# Patient Record
Sex: Female | Born: 1960 | Race: White | Hispanic: No | Marital: Married | Smoking: Never smoker
Health system: Southern US, Community
[De-identification: ages and names within clinical notes are randomized; demographics above are authoritative.]

## PROBLEM LIST (undated history)

## (undated) HISTORY — PX: BREAST BIOPSY: SHX20

## (undated) HISTORY — PX: TUBAL LIGATION: SHX77

## (undated) HISTORY — PX: OTHER SURGICAL HISTORY: SHX169

## (undated) HISTORY — PX: BREAST SURGERY: SHX581

## (undated) HISTORY — PX: FOOT SURGERY: SHX648

## (undated) HISTORY — PX: APPENDECTOMY: SHX54

---

## 1990-07-28 HISTORY — PX: REDUCTION MAMMAPLASTY: SUR839

## 1998-07-28 HISTORY — PX: OTHER SURGICAL HISTORY: SHX169

## 2005-12-31 ENCOUNTER — Ambulatory Visit: Payer: Self-pay | Admitting: Family Medicine

## 2006-02-11 ENCOUNTER — Ambulatory Visit: Payer: Self-pay | Admitting: Family Medicine

## 2006-02-11 ENCOUNTER — Other Ambulatory Visit: Admission: RE | Admit: 2006-02-11 | Discharge: 2006-02-11 | Payer: Self-pay | Admitting: Family Medicine

## 2006-05-05 DIAGNOSIS — E7800 Pure hypercholesterolemia, unspecified: Secondary | ICD-10-CM

## 2006-05-05 DIAGNOSIS — K219 Gastro-esophageal reflux disease without esophagitis: Secondary | ICD-10-CM | POA: Insufficient documentation

## 2006-06-12 ENCOUNTER — Encounter: Payer: Self-pay | Admitting: Family Medicine

## 2006-08-19 ENCOUNTER — Ambulatory Visit: Payer: Self-pay | Admitting: Family Medicine

## 2006-08-19 DIAGNOSIS — S139XXA Sprain of joints and ligaments of unspecified parts of neck, initial encounter: Secondary | ICD-10-CM

## 2006-11-25 ENCOUNTER — Ambulatory Visit: Payer: Self-pay | Admitting: Family Medicine

## 2007-03-10 ENCOUNTER — Telehealth: Payer: Self-pay | Admitting: Family Medicine

## 2007-03-23 ENCOUNTER — Ambulatory Visit: Payer: Self-pay | Admitting: Family Medicine

## 2007-03-23 ENCOUNTER — Encounter: Admission: RE | Admit: 2007-03-23 | Discharge: 2007-03-23 | Payer: Self-pay | Admitting: Family Medicine

## 2007-03-24 ENCOUNTER — Encounter: Payer: Self-pay | Admitting: Family Medicine

## 2007-03-24 LAB — CONVERTED CEMR LAB
AST: 12 units/L (ref 0–37)
Albumin: 4.6 g/dL (ref 3.5–5.2)
Alkaline Phosphatase: 67 units/L (ref 39–117)
CO2: 24 meq/L (ref 19–32)
Chloride: 106 meq/L (ref 96–112)
Cholesterol, target level: 200 mg/dL
Creatinine, Ser: 0.68 mg/dL (ref 0.40–1.20)
Glucose, Bld: 99 mg/dL (ref 70–99)
HDL: 45 mg/dL (ref >39)
LDL Cholesterol: 146 mg/dL — ABNORMAL HIGH (ref 0–99)
LDL Goal: 160 mg/dL
TSH: 1.33 microintl units/mL (ref 0.350–5.50)

## 2007-12-27 ENCOUNTER — Ambulatory Visit: Payer: Self-pay | Admitting: Family Medicine

## 2007-12-31 ENCOUNTER — Telehealth: Payer: Self-pay | Admitting: Family Medicine

## 2008-01-18 ENCOUNTER — Ambulatory Visit: Payer: Self-pay | Admitting: Family Medicine

## 2008-01-18 ENCOUNTER — Encounter: Admission: RE | Admit: 2008-01-18 | Discharge: 2008-01-18 | Payer: Self-pay | Admitting: Family Medicine

## 2008-01-19 LAB — CONVERTED CEMR LAB
Alkaline Phosphatase: 61 units/L (ref 39–117)
Amylase: 33 units/L (ref 0–105)
BUN: 10 mg/dL (ref 6–23)
Basophils Absolute: 0 10*3/uL (ref 0.0–0.1)
HCT: 41.7 % (ref 36.0–46.0)
Lymphocytes Relative: 32 % (ref 12–46)
Lymphs Abs: 1.7 10*3/uL (ref 0.7–4.0)
Monocytes Absolute: 0.3 10*3/uL (ref 0.1–1.0)
Monocytes Relative: 5 % (ref 3–12)
Neutro Abs: 3.2 10*3/uL (ref 1.7–7.7)
Neutrophils Relative %: 62 % (ref 43–77)
Platelets: 296 10*3/uL (ref 150–400)
RBC: 4.49 M/uL (ref 3.87–5.11)
TSH: 0.893 microintl units/mL (ref 0.350–5.50)

## 2008-01-21 ENCOUNTER — Encounter: Payer: Self-pay | Admitting: Family Medicine

## 2008-05-11 ENCOUNTER — Telehealth: Payer: Self-pay | Admitting: Family Medicine

## 2008-06-06 ENCOUNTER — Telehealth: Payer: Self-pay | Admitting: Family Medicine

## 2008-09-18 ENCOUNTER — Encounter: Payer: Self-pay | Admitting: Family Medicine

## 2008-09-18 ENCOUNTER — Other Ambulatory Visit: Admission: RE | Admit: 2008-09-18 | Discharge: 2008-09-18 | Payer: Self-pay | Admitting: Family Medicine

## 2008-09-18 ENCOUNTER — Ambulatory Visit: Payer: Self-pay | Admitting: Family Medicine

## 2008-09-18 ENCOUNTER — Encounter: Admission: RE | Admit: 2008-09-18 | Discharge: 2008-09-18 | Payer: Self-pay | Admitting: Family Medicine

## 2008-09-18 DIAGNOSIS — K589 Irritable bowel syndrome without diarrhea: Secondary | ICD-10-CM | POA: Insufficient documentation

## 2008-09-19 ENCOUNTER — Encounter: Payer: Self-pay | Admitting: Family Medicine

## 2008-09-19 LAB — CONVERTED CEMR LAB
ALT: 12 units/L (ref 0–35)
AST: 15 units/L (ref 0–37)
Albumin: 4.4 g/dL (ref 3.5–5.2)
Alkaline Phosphatase: 66 units/L (ref 39–117)
CO2: 20 meq/L (ref 19–32)
Glucose, Bld: 112 mg/dL — ABNORMAL HIGH (ref 70–99)
HDL: 43 mg/dL (ref >39)
TSH: 0.887 microintl units/mL (ref 0.350–4.50)
Total Protein: 6.9 g/dL (ref 6.0–8.3)

## 2008-09-21 LAB — CONVERTED CEMR LAB: Pap Smear: NORMAL

## 2008-11-06 ENCOUNTER — Telehealth: Payer: Self-pay | Admitting: Family Medicine

## 2009-07-28 HISTORY — PX: OTHER SURGICAL HISTORY: SHX169

## 2009-09-19 ENCOUNTER — Telehealth: Payer: Self-pay | Admitting: Family Medicine

## 2009-10-26 ENCOUNTER — Encounter: Payer: Self-pay | Admitting: Family Medicine

## 2009-10-29 ENCOUNTER — Ambulatory Visit: Payer: Self-pay | Admitting: Family Medicine

## 2009-10-30 LAB — CONVERTED CEMR LAB
Cholesterol: 219 mg/dL — ABNORMAL HIGH (ref 0–200)
LDL Cholesterol: 144 mg/dL — ABNORMAL HIGH (ref 0–99)
Potassium: 4.2 meq/L (ref 3.5–5.3)
Sodium: 140 meq/L (ref 135–145)
Total CHOL/HDL Ratio: 5.8

## 2009-11-26 ENCOUNTER — Telehealth: Payer: Self-pay | Admitting: Family Medicine

## 2009-12-12 ENCOUNTER — Telehealth: Payer: Self-pay | Admitting: Family Medicine

## 2009-12-12 DIAGNOSIS — M549 Dorsalgia, unspecified: Secondary | ICD-10-CM | POA: Insufficient documentation

## 2009-12-31 ENCOUNTER — Encounter: Admission: RE | Admit: 2009-12-31 | Discharge: 2009-12-31 | Payer: Self-pay | Admitting: Family Medicine

## 2010-03-07 ENCOUNTER — Ambulatory Visit: Payer: Self-pay | Admitting: Family Medicine

## 2010-03-07 DIAGNOSIS — M2550 Pain in unspecified joint: Secondary | ICD-10-CM | POA: Insufficient documentation

## 2010-03-08 ENCOUNTER — Encounter: Payer: Self-pay | Admitting: Family Medicine

## 2010-03-08 LAB — CONVERTED CEMR LAB
Basophils Absolute: 0 10*3/uL (ref 0.0–0.1)
Basophils Relative: 0 % (ref 0–1)
Eosinophils Absolute: 0.1 10*3/uL (ref 0.0–0.7)
Eosinophils Relative: 1 % (ref 0–5)
Hemoglobin: 13.7 g/dL (ref 12.0–15.0)
Lymphs Abs: 2.7 10*3/uL (ref 0.7–4.0)
MCHC: 32.7 g/dL (ref 30.0–36.0)
Neutro Abs: 3.8 10*3/uL (ref 1.7–7.7)
Platelets: 291 10*3/uL (ref 150–400)
Sed Rate: 18 mm/hr (ref 0–22)
WBC: 7.2 10*3/uL (ref 4.0–10.5)

## 2010-03-12 ENCOUNTER — Telehealth: Payer: Self-pay | Admitting: Family Medicine

## 2010-03-12 DIAGNOSIS — M25569 Pain in unspecified knee: Secondary | ICD-10-CM | POA: Insufficient documentation

## 2010-03-13 ENCOUNTER — Encounter: Admission: RE | Admit: 2010-03-13 | Discharge: 2010-03-13 | Payer: Self-pay | Admitting: Family Medicine

## 2010-03-13 ENCOUNTER — Telehealth: Payer: Self-pay | Admitting: Family Medicine

## 2010-03-18 ENCOUNTER — Encounter: Payer: Self-pay | Admitting: Family Medicine

## 2010-04-17 ENCOUNTER — Encounter: Payer: Self-pay | Admitting: Family Medicine

## 2010-04-17 ENCOUNTER — Encounter: Admission: RE | Admit: 2010-04-17 | Discharge: 2010-04-17 | Payer: Self-pay | Admitting: Internal Medicine

## 2010-05-20 ENCOUNTER — Telehealth: Payer: Self-pay | Admitting: Family Medicine

## 2010-05-31 ENCOUNTER — Telehealth: Payer: Self-pay | Admitting: Family Medicine

## 2010-06-03 ENCOUNTER — Encounter: Payer: Self-pay | Admitting: Family Medicine

## 2010-08-27 NOTE — Letter (Signed)
Summary: [facility]   Imported By: Lanelle Bal 06/24/2010 08:22:18  _____________________________________________________________________  External Attachment:    Type:   Image     Comment:   External Document

## 2010-08-27 NOTE — Progress Notes (Signed)
Summary: Referral   Phone Note Call from Patient   Caller: Patient Summary of Call: Pt would like referral to [facility] to see (sp?)  Dr. Patton Salles for a breast reduction. Pt discussed this at CPE w/ Dr. Judie Petit but needs referral for insurance. Initial call taken by: Payton Spark CMA,  Dec 12, 2009 3:10 PM  New Problems: BACK PAIN (ICD-724.5)   New Problems: BACK PAIN (ICD-724.5)

## 2010-08-27 NOTE — Progress Notes (Signed)
Summary: ortho requesting a ortho referral   Phone Note From Other Clinic   Caller: Receptionist Summary of Call: Dr. Nilsa Nutting office called and pt has decided to see Dr.Olin before P.T and they need a Ortho order Referral fax l to [phone] Initial call taken by: Michaelle Copas,  May 31, 2010 1:22 PM  Follow-up for Phone Call        That is fine. I put it in. Will you sign adn print? Follow-up by: Nani Gasser MD,  May 31, 2010 2:36 PM  Additional Follow-up for Phone Call Additional follow up Details #1::        Order faxed. Additional Follow-up by: Kathlene November LPN,  May 31, 2010 3:13 PM

## 2010-08-27 NOTE — Progress Notes (Signed)
Summary: knee pain  Phone Note Call from Patient   Caller: Patient Call For: Tonya Gasser MD Summary of Call: pt called and states she is still having the knee pain and the prednisone did not help at all.Pt is taking "pain pills from her surgery" because the meloxicam did not help. Wants to know what to do. wants to know if we can go ahead and set her up for a rheum appt Initial call taken by: Avon Gully CMA, Duncan Dull),  March 12, 2010 3:29 PM  Follow-up for Phone Call        Absolutely. Lets refer to Rheum.  Has she had an xray of her knee?  Follow-up by: Tonya Gasser MD,  March 12, 2010 5:13 PM  Additional Follow-up for Phone Call Additional follow up Details #1::        Pt  is scheduled with rheumatology next week the 22nd. Has not had any xrays of knee. But is really hurting her and wants to know what she can do.Has 2 days left of the prednisone and it is not helping. Using pain pills from surgery and it is not helping it s a constant throb. Please advise Pt doesnt know what med regiman to do during day because by the time she gets home and takes a pain pill she is miserable Additional Follow-up by: Kathlene November,  March 13, 2010 10:10 AM  New Problems: KNEE PAIN, RIGHT [phone])   Additional Follow-up for Phone Call Additional follow up Details #2::    Lets get xray.  Also lets try tramadol for daytime and then hydrocodone for evenings once home.  Follow-up by: Tonya Gasser MD,  March 13, 2010 11:09 AM  Additional Follow-up for Phone Call Additional follow up Details #3:: Details for Additional Follow-up Action Taken: Pt notified of above and that med sent to pharmacy Additional Follow-up by: Kathlene November,  March 13, 2010 11:51 AM  New Problems: KNEE PAIN, RIGHT (ICD-719.46) New/Updated Medications: HYDROCODONE-ACETAMINOPHEN 5-325 MG TABS (HYDROCODONE-ACETAMINOPHEN) 1-2 tabs in the evening as needed TRAMADOL HCL 200 MG XR24H-TAB (TRAMADOL HCL) Take 1  tablet by mouth once a day in the AM. Prescriptions: TRAMADOL HCL 200 MG XR24H-TAB (TRAMADOL HCL) Take 1 tablet by mouth once a day in the AM.  #30 x 1   Entered and Authorized by:   Tonya Gasser MD   Signed by:   Tonya Gasser MD on 03/13/2010   Method used:   Electronically to        [facility] [phone]* (retail)       [address] [place], [place]  [address]       Ph: [phone]       Fax: [phone]   RxID:   [id number] HYDROCODONE-ACETAMINOPHEN 5-325 MG TABS (HYDROCODONE-ACETAMINOPHEN) 1-2 tabs in the evening as needed  #60 x 0   Entered and Authorized by:   Tonya Gasser MD   Signed by:   Tonya Gasser MD on 03/13/2010   Method used:   Printed then faxed to ...       [address] [phone]* (retail)       [address] [place], [place]  [address]       Ph: [phone]       Fax: [phone]   RxID:   [id number]

## 2010-08-27 NOTE — Progress Notes (Signed)
Summary: PA for referral  Phone Note From Other Clinic   Caller: Pat at Dr. Shawnee Knapp office Summary of Call: Pt will need to have PA from insurance company prior to apt. Please call Dennie Bible for more info [phone] Initial call taken by: Payton Spark CMA,  March 13, 2010 12:39 PM  Follow-up for Phone Call        Called and spoke with Dennie Bible. Faxed her the completed [employer] referral form and faxed one to [facility] Follow-up by: Kathlene November,  March 13, 2010 1:35 PM

## 2010-08-27 NOTE — Progress Notes (Signed)
Summary: Ear itching  Phone Note Call from Patient Call back at Work Phone [phone]   Caller: Patient Call For: Nani Gasser MD Action Taken: Patient advised to call [phone] Summary of Call: pt states that the Claritin bothered her stomach same as Zyrtec did. Left ear still itching and some swelling and wondered if she could get a topical to put in the ear. Said along time ago a doc gave her something that was clear and she put on Qtip and put in the ear Initial call taken by: Kathlene November,  Nov 26, 2009 12:39 PM  Follow-up for Phone Call        OK sent rx to pharm. Call if not better in one week.  Follow-up by: Nani Gasser MD,  Nov 26, 2009 1:09 PM  Additional Follow-up for Phone Call Additional follow up Details #1::        Pt notified. Additional Follow-up by: Kathlene November,  Nov 26, 2009 1:17 PM    New/Updated Medications: CORTISPORIN 1 % OINT (BACIT-POLY-NEO HC) Apply to inner ear two times a day with Qtip for one week Prescriptions: CORTISPORIN 1 % OINT (BACIT-POLY-NEO HC) Apply to inner ear two times a day with Qtip for one week  #1 tube x 0   Entered and Authorized by:   Nani Gasser MD   Signed by:   Nani Gasser MD on 11/26/2009   Method used:   Electronically to        [facility] [phone]* (retail)       [address] [place], [place]  [address]       Ph: [phone]       Fax: [phone]   RxID:   [id number]

## 2010-08-27 NOTE — Medication Information (Signed)
Summary: Prevacid/[employer]  Prevacid/[employer]   Imported By: Lanelle Bal 11/05/2009 10:39:57  _____________________________________________________________________  External Attachment:    Type:   Image     Comment:   External Document

## 2010-08-27 NOTE — Assessment & Plan Note (Signed)
Summary: ear itching, nipple chaffing.    Vital Signs:  Patient profile:   50 year old female Height:      66 inches Weight:      190 pounds BMI:     30.78 Pulse rate:   64 / minute BP sitting:   124 / 77  (left arm) Cuff size:   regular  Vitals Entered By: Kathlene November (October 29, 2009 9:54 AM) CC: wants bloodwork- left ear itching ? allergies. Wants mole looked at on left hip, back and under right axilla   Primary Care Provider:  Linford Arnold, c  CC:  wants bloodwork- left ear itching ? allergies. Wants mole looked at on left hip and back and under right axilla.  History of Present Illness: wants bloodwork- left ear itching ? allergies. Wants mole looked at on left hip, back and under right axilla.  Left ear has bothered her before and was given a cream. Went to ENT at one time and was told it was allergy related. Started some benadryl but it does upset her stomach and does help some.  Feels like  adeep itch.  No drainage or hearing loss. Using her prevacid and Zantac.  No it chy nose or watery nose.   she has been working out 4-5 days a week and has lost 5 lbs.  She is thinking about having another breast reduction.  She feels the weight down her necka nd shoulder and "get in her way with exercise". She has also been getting sore nipples iwht working out.   Current Medications (verified): 1)  Glucosamine-Chondroitin  Tabs (Glucosamine-Chondroitin Tabs) .... Once Daily 2)  Calcium 600 1500 Mg Tabs (Calcium Carbonate) .... Take One Tablet Twice A Day 3)  Diflunisal 500 Mg Tabs (Diflunisal) .... Take 1 Tablet By Mouth Two Times A Day As Needed Forpain 4)  Prevacid 30 Mg Cpdr (Lansoprazole) .... Take 1 Tablet By Mouth Once A Day  Allergies (verified): 1)  ! Sulfa  Comments:  Nurse/Medical Assistant: The patient's medications and allergies were reviewed with the patient and were updated in the Medication and Allergy Lists. Kathlene November (October 29, 2009 9:55 AM)  Physical  Exam  General:  Well-developed,well-nourished,in no acute distress; alert,appropriate and cooperative throughout examination Head:  Normocephalic and atraumatic without obvious abnormalities. No apparent alopecia or balding. Eyes:  No corneal or conjunctival inflammation noted. EOMI. Perrla. Ears:  External ear exam shows no significant lesions or deformities.  Otoscopic examination reveals clear canals, tympanic membranes are intact bilaterally without bulging, retraction, inflammation or discharge. Hearing is grossly normal bilaterally. Nose:  External nasal examination shows no deformity or inflammation. Nasal mucosa are pink and moist without lesions or exudates. Mouth:  Oral mucosa and oropharynx without lesions or exudates.  Teeth in good repair. Neck:  No deformities, masses, or tenderness noted. Lungs:  Normal respiratory effort, chest expands symmetrically. Lungs are clear to auscultation, no crackles or wheezes. Heart:  Normal rate and regular rhythm. S1 and S2 normal without gallop, murmur, click, rub or other extra sounds. Skin:  no rashes.  Has several seborrheic keratosise on her low and upper back.  Has several tag moles under her right axilla.  Psych:  Cognition and judgment appear intact. Alert and cooperative with normal attention span and concentration. No apparent delusions, illusions, hallucinations   Impression & Recommendations:  Problem # 1:  UNSPECIFIED PRURITIC DISORDER (ICD-698.9) I do think the ear itching is allergy related. Recommend trial of OTC claritin or zyrtec as  the benadryl does help but only works for about 4-6 hours.  I don't see any eczemaor rash in the ear so will hold off on any topical creams.    Problem # 2:  OTHER ABNORMAL GLUCOSE (ICD-790.29) Due to recheck today.  Orders: T-Basic Metabolic Panel ([id number])  Problem # 3:  HYPERCHOLESTEROLEMIA (ICD-272.0) Due to recheck today.  Orders: T-Lipid Profile ([id number])  Problem # 4:   UNSPECIFIED BREAST DISORDER (ICD-611.9) For the nipple chaffing discussed getting a really good fitting bra with adjustable straps and a front closure that is very secure.    Complete Medication List: 1)  Glucosamine-chondroitin Tabs (Glucosamine-chondroitin tabs) .... Once daily 2)  Calcium 600 1500 Mg Tabs (Calcium carbonate) .... Take one tablet twice a day 3)  Diflunisal 500 Mg Tabs (Diflunisal) .... Take 1 tablet by mouth two times a day as needed forpain 4)  Prevacid 30 Mg Cpdr (Lansoprazole) .... Take 1 tablet by mouth once a day

## 2010-08-27 NOTE — Assessment & Plan Note (Signed)
Summary: Rash, Joint pain and swelling.    Vital Signs:  Patient profile:   50 year old female Height:      66 inches Weight:      190 pounds Pulse rate:   60 / minute BP sitting:   113 / 69  (left arm) Cuff size:   regular  Vitals Entered By: Kathlene November (March 07, 2010 1:00 PM) CC: rash on back x 1 week- itches also has under breast for a while now. Also having right knee, right shoulder and 2nd digit on right hand joint pain ? arthritis   Primary Care Provider:  Linford Arnold, c  CC:  rash on back x 1 week- itches also has under breast for a while now. Also having right knee and right shoulder and 2nd digit on right hand joint pain ? arthritis.  History of Present Illness: rash on back x 1 week- itches also has under breast for a while now. Ithcing is very mild. Hasn't tried any creams or treatments.    Also having right knee, right shoulder and 2nd digit on right hand joint pain ? arthritis.  Has been usin IBU1000mg  daily and does help but wonders if something stronger.  Tody her right knee and right firts finger DIP and the right shoulder are very painful. The joint on th ehand is swollen and slightly red. Went back on glucosamin on 12/22/2022 ( had surgery recently so had to come off).  Using thermacare patches on her knee.  Not really helping.   Current Medications (verified): 1)  Glucosamine-Chondroitin  Tabs (Glucosamine-Chondroitin Tabs) .... Once Daily 2)  Calcium 600 1500 Mg Tabs (Calcium Carbonate) .... Take One Tablet Twice A Day 3)  Diflunisal 500 Mg Tabs (Diflunisal) .... Take 1 Tablet By Mouth Two Times A Day As Needed Forpain 4)  Prevacid 30 Mg Cpdr (Lansoprazole) .... Take 1 Tablet By Mouth Once A Day 5)  Fish Oil 1000 Mg Caps (Omega-3 Fatty Acids) .... 2 By Mouth Daily. 6)  Cortisporin 1 % Oint (Bacit-Poly-Neo Hc) .... Apply To Inner Ear Two Times A Day With Qtip For One Week  Allergies (verified): 1)  ! Sulfa  Comments:  Nurse/Medical Assistant: The patient's  medications and allergies were reviewed with the patient and were updated in the Medication and Allergy Lists. Kathlene November (March 07, 2010 1:02 PM)  Family History: Father died Lung Ca (mesothelioma)  Dec 22, 1987, gout  Physical Exam  General:  Well-developed,well-nourished,in no acute distress; alert,appropriate and cooperative throughout examination Msk:  Right knee with NROM. No inc warmth or erythema but very tender over the top medial edge of the tibia and along the medial joint line. The first finger on the right hand DIP jonit is swollen and with slight erythema. Decreased flexion secondary to swelling.  Skin:  BAck with diffuse oval dry light tan macules in a christmas tree pattern on her back. Also has a hyperpigmendt patch under each breast with some dry scaling as well. Rash is also onher upper chest and on her neck.  Psych:  Cognition and judgment appear intact. Alert and cooperative with normal attention span and concentration. No apparent delusions, illusions, hallucinations   Impression & Recommendations:  Problem # 1:  SKIN RASH (ICD-782.1) Assessment New The rash looks like pityriasis rosea but will scrape for KOH to rule out tinea.  Will call with results.  Orders: T-KOH Prep Fungal ([id number])  Problem # 2:  POLYARTHRITIS (URK-270.62) Assessment: New Discussed RA vs gout vs OA.  Pt  sxs most consistant with OA at this point but will check sed rate since the arthritis tends to migrate and they do get red and swolen. Since her pain is severe today will treat wiht steroids. then can try the meloxicam and see if likes it better than the IBU. Warned not to mix the two together. Can take Tyelnol if needes supplement pain management.  Can consider rheum referral.  Orders: T-CBC w/Diff ([id number]) T-TSH [id number]) T-Sed Rate (Automated) [phone]) T-Rheumatoid Factor [id number]) T-Uric Acid (Blood) [id number])  Complete Medication List: 1)  Glucosamine-chondroitin  Tabs (Glucosamine-chondroitin tabs) .... Once daily 2)  Calcium 600 1500 Mg Tabs (Calcium carbonate) .... Take one tablet twice a day 3)  Diflunisal 500 Mg Tabs (Diflunisal) .... Take 1 tablet by mouth two times a day as needed forpain 4)  Prevacid 30 Mg Cpdr (Lansoprazole) .... Take 1 tablet by mouth once a day 5)  Fish Oil 1000 Mg Caps (Omega-3 fatty acids) .... 2 by mouth daily. 6)  Cortisporin 1 % Oint (Bacit-poly-neo hc) .... Apply to inner ear two times a day with qtip for one week 7)  Meloxicam 15 Mg Tabs (Meloxicam) .... Take 1 tablet by mouth once a day 8)  Prednisone 20 Mg Tabs (Prednisone) .... 2 tabs by mouth daily for 7 days  Patient Instructions: 1)  Please schedule a follow-up appointment in 1 month for joint pain.  Prescriptions: PREDNISONE 20 MG TABS (PREDNISONE) 2 tabs by mouth daily for 7 days  #14 x 0   Entered and Authorized by:   Nani Gasser MD   Signed by:   Nani Gasser MD on 03/07/2010   Method used:   Electronically to        [facility] [phone]* (retail)       [address] [place], [place]  [address]       Ph: [phone]       Fax: [phone]   RxID:   [id number] MELOXICAM 15 MG TABS (MELOXICAM) Take 1 tablet by mouth once a day  #30 x 1   Entered and Authorized by:   Nani Gasser MD   Signed by:   Nani Gasser MD on 03/07/2010   Method used:   Electronically to        [facility] [phone]* (retail)       [address] [place], [place]  [address]       Ph: [phone]       Fax: [phone]   RxID:   [id number]

## 2010-08-27 NOTE — Progress Notes (Signed)
Summary: Amitiza Rx  Phone Note Call from Patient Call back at Work Phone [phone]   Caller: Patient Call For: Nani Gasser MD Summary of Call: Pt would like Rx for Amitiza sent to [facility]- said discussed last time she was in here Initial call taken by: Kathlene November,  September 19, 2009 9:52 AM  Follow-up for Phone Call        OK will send. Let me know if helping.  Follow-up by: Nani Gasser MD,  September 19, 2009 10:12 AM  Additional Follow-up for Phone Call Additional follow up Details #1::        Pt notified med sent to pharmacy Additional Follow-up by: Kathlene November,  September 19, 2009 10:18 AM    New/Updated Medications: AMITIZA 24 MCG CAPS (LUBIPROSTONE) Take 1 tablet by mouth two times a day Prescriptions: AMITIZA 24 MCG CAPS (LUBIPROSTONE) Take 1 tablet by mouth two times a day  #60 x 0   Entered and Authorized by:   Nani Gasser MD   Signed by:   Nani Gasser MD on 09/19/2009   Method used:   Electronically to        [facility] [phone]* (retail)       [address] [place], [place]  [address]       Ph: [phone]       Fax: [phone]   RxID:   [id number]

## 2010-08-27 NOTE — Consult Note (Signed)
Summary: [facility]   Imported By: Lanelle Bal 04/04/2010 09:37:40  _____________________________________________________________________  External Attachment:    Type:   Image     Comment:   External Document

## 2010-08-27 NOTE — Progress Notes (Signed)
Summary: ? appt confusion  Phone Note Call from Patient Call back at Work Phone [phone]   Caller: Patient Call For: Nani Gasser MD Summary of Call: Pt calls and ask to speak with you in regards to her PT appt. She said she thought it was at ortho office. I looked under order and its here in [place]. She said there is confusion apparantly. Please call her back Initial call taken by: Kathlene November LPN,  May 20, 2010 9:03 AM  Follow-up for Phone Call        called and left message to confirm with pt as we had spoke earlier that appt has been scheduled at [place] [facility] and appt is 1030 11/11 with Dr. Charlann Boxer. Follow-up by: Avon Gully CMA, Duncan Dull),  May 20, 2010 10:19 AM

## 2010-09-18 ENCOUNTER — Encounter: Payer: Self-pay | Admitting: Family Medicine

## 2010-09-23 ENCOUNTER — Encounter (INDEPENDENT_AMBULATORY_CARE_PROVIDER_SITE_OTHER): Payer: Managed Care, Other (non HMO) | Admitting: Family Medicine

## 2010-09-23 ENCOUNTER — Other Ambulatory Visit (HOSPITAL_COMMUNITY)
Admission: RE | Admit: 2010-09-23 | Discharge: 2010-09-23 | Disposition: A | Discharge status: Home / Self Care | Payer: Managed Care, Other (non HMO) | Source: Ambulatory Visit | Attending: Family Medicine | Admitting: Family Medicine

## 2010-09-23 ENCOUNTER — Other Ambulatory Visit: Payer: Self-pay | Admitting: Family Medicine

## 2010-09-23 ENCOUNTER — Encounter: Payer: Self-pay | Admitting: Family Medicine

## 2010-09-23 ENCOUNTER — Telehealth: Payer: Self-pay | Admitting: Family Medicine

## 2010-09-23 DIAGNOSIS — Z0000 Encounter for general adult medical examination without abnormal findings: Secondary | ICD-10-CM

## 2010-09-23 DIAGNOSIS — G609 Hereditary and idiopathic neuropathy, unspecified: Secondary | ICD-10-CM | POA: Insufficient documentation

## 2010-09-23 DIAGNOSIS — Z1159 Encounter for screening for other viral diseases: Secondary | ICD-10-CM | POA: Insufficient documentation

## 2010-09-23 DIAGNOSIS — Z01419 Encounter for gynecological examination (general) (routine) without abnormal findings: Secondary | ICD-10-CM | POA: Insufficient documentation

## 2010-09-23 DIAGNOSIS — K589 Irritable bowel syndrome without diarrhea: Secondary | ICD-10-CM

## 2010-09-24 LAB — CONVERTED CEMR LAB
AST: 14 units/L (ref 0–37)
Alkaline Phosphatase: 58 units/L (ref 39–117)
Calcium: 9.4 mg/dL (ref 8.4–10.5)
HDL: 33 mg/dL — ABNORMAL LOW (ref >39)
Potassium: 4.2 meq/L (ref 3.5–5.3)
Total Bilirubin: 0.5 mg/dL (ref 0.3–1.2)
Total CHOL/HDL Ratio: 6.2

## 2010-09-26 ENCOUNTER — Encounter: Payer: Self-pay | Admitting: Family Medicine

## 2010-09-27 ENCOUNTER — Encounter: Payer: Self-pay | Admitting: Family Medicine

## 2010-09-27 LAB — CYTOLOGY - PAP: Pap Smear: NORMAL

## 2010-10-03 NOTE — Progress Notes (Signed)
  Phone Note Call from Patient   Summary of Call: labs for eCPE.  Initial call taken by: Nani Gasser MD,  September 23, 2010 8:43 AM  New Problems: EXAMINATION, ROUTINE MEDICAL (ICD-V70.0)   New Problems: EXAMINATION, ROUTINE MEDICAL (ICD-V70.0)

## 2010-10-03 NOTE — Assessment & Plan Note (Signed)
Summary: CPE w/ pap   Vital Signs:  Patient profile:   50 year old female Height:      66 inches Weight:      194 pounds Pulse rate:   67 / minute BP sitting:   138 / 80  (right arm) Cuff size:   regular  Vitals Entered By: Avon Gully CMA, (AAMA) (September 23, 2010 10:08 AM) CC: CPE and PAP   Primary Care Provider:  Levis Nazir, c  CC:  CPE and PAP.  History of Present Illness: Recently started exercising again. she is trying to lose some weight.  Says her feet have really been bothering here. she reports she has had tingling and sharp pain prick pains in her feet.  She says that often times is worse the day after her she tries to walk or exercise a lot.  She feels actually get a little red and swollen as well she denies any trauma.  She has no history of diabetes.  She does look as if her most of her feet bilaterally.she said she wears good supportive shoe wear on a daily basis.  she is still having a lot of problems with her bowels.  She frequently will screen from diarrhea to constipation.  She does feel she is probably constipation predominant.  She denies any blood in the stool but has had a lot of problems with her hemorrhoids.  She said previously daily bothered her in a while but now it seems that she is constantly battling inflammation in that area.  We had tried her on on its use in the past but she did like the idea of taking pill every day.  Recently she has started MiraLax daily and says that that has actually made a big difference for her especially concerning the constipation.  Though it does tend to make the diarrhea a little worse when it does fit.  She also tried some stool softening pills but this didn't seem to help at all  Current Medications (verified): 1)  Glucosamine-Chondroitin  Tabs (Glucosamine-Chondroitin Tabs) .... Once Daily 2)  Calcium 600 1500 Mg Tabs (Calcium Carbonate) .... Take One Tablet Twice A Day 3)  Diflunisal 500 Mg Tabs (Diflunisal) .... Take  1 Tablet By Mouth Two Times A Day As Needed Forpain 4)  Prevacid 30 Mg Cpdr (Lansoprazole) .... Take 1 Tablet By Mouth Once A Day 5)  Fish Oil 1000 Mg Caps (Omega-3 Fatty Acids) .... 2 By Mouth Daily.  Allergies (verified): 1)  ! Sulfa  Comments:  Nurse/Medical Assistant: The patient's medications and allergies were reviewed with the patient and were updated in the Medication and Allergy Lists. 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  Married to [place] with 4 children.  Never smoked, 1-2 EtOH/mo, no drugs, 1 caff daily, walks for 50-60 min 3 x/week  Past Surgical History: Appendectomy  12-20-1972, Breast Reduction  1993, Tubal ligation  12-21-90, Tummy Tuck   2006, Vag ablation  12/20/97 Breast reductionn and lift Dec 20, 2009  Review of Systems  The patient denies anorexia, fever, weight loss, weight gain, vision loss, decreased hearing, hoarseness, chest pain, syncope, dyspnea on exertion, peripheral edema, prolonged cough, headaches, hemoptysis, abdominal pain, melena, hematochezia, severe indigestion/heartburn, hematuria, incontinence, genital sores, muscle  weakness, suspicious skin lesions, transient blindness, difficulty walking, depression, unusual weight change, abnormal bleeding, enlarged lymph nodes, and breast masses.    Physical Exam  General:  Well-developed,well-nourished,in no acute distress; alert,appropriate and cooperative throughout examination Head:  Normocephalic and atraumatic without obvious abnormalities. No apparent alopecia or balding. Eyes:  No corneal or conjunctival inflammation noted. EOMI. Perrla.  Ears:  External ear exam shows no significant lesions or deformities.   Otoscopic examination reveals clear canals, tympanic membranes are intact bilaterally without bulging, retraction, inflammation or discharge. Hearing is grossly normal bilaterally. Nose:  External nasal examination shows no deformity or inflammation. Mouth:  Oral mucosa and oropharynx without lesions or exudates.  Teeth in good repair. Neck:  No deformities, masses, or tenderness noted. Chest Wall:  No deformities, masses, or tenderness noted. Breasts:  No mass, nodules, thickening, tenderness, bulging, retraction, inflamation, nipple discharge or skin changes noted.   Lungs:  Normal respiratory effort, chest expands symmetrically. Lungs are clear to auscultation, no crackles or wheezes. Heart:  Normal rate and regular rhythm. S1 and S2 normal without gallop, murmur, click, rub or other extra sounds. Abdomen:  Bowel sounds positive,abdomen soft and non-tender without masses, organomegaly or hernias noted. Genitalia:  Normal introitus for age, no external lesions, no vaginal discharge, mucosa pink and moist, no vaginal or cervical lesions, no vaginal atrophy, no friaility or hemorrhage, normal uterus size and position, no adnexal masses or tenderness Msk:  No deformity or scoliosis noted of thoracic or lumbar spine.   Pulses:  R and L carotid,radial,femoral,dorsalis pedis and posterior tibial pulses are full and equal bilaterally Extremities:  No clubbing, cyanosis, edema, or deformity noted with normal full range of motion of all joints.   Neurologic:  No cranial nerve deficits noted. Station and gait are normal.. . Sensory, motor and coordinative functions appear intact. Skin:  no rashes.   Cervical Nodes:  No lymphadenopathy noted Axillary Nodes:  No palpable lymphadenopathy Psych:  Cognition and judgment appear intact. Alert and cooperative with normal attention span and concentration. No apparent delusions, illusions, hallucinations   Impression & Recommendations:  Problem # 1:   EXAMINATION, ROUTINE MEDICAL (ICD-V70.0) Got her labs this AM.   Exam is normal.  her exam is normal today. encourage her to definitely get back into her exercise that she has gained weight since her last office visit. Recommend daily calcium intake.  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  If this is normal then I recommend that she follow-up to have a more thorough discussion about her neuropathy.  She is not diabetic. consider further evaluation with neurology.  Problem # 3:  IRRITABLE BOWEL SYNDROME (ICD-564.1) we discussed that she likely has irritable bowel syndrome.  She did not like the Amitiza.  The MiraLax seems to be working well and she could certainly continue to use this.we also discussed the importance of a high fiber diet and a half IBS.  Recommend starting a chronic Metamucil or Benefiber to help.  This will be in addition to increasing her fruits and veggies in her diet and continuing to stay hydrated.  Also I recommend we refer to GI for further evaluation to rule out other causes as she is close to the age of 82.  That evening.  The bowel syndrome. Orders: Gastroenterology Referral (GI)  Complete Medication List: 1)  Glucosamine-chondroitin  Tabs (Glucosamine-chondroitin tabs) .... Once daily 2)  Calcium 600 1500 Mg Tabs (Calcium carbonate) .... Take one tablet twice a day 3)  Diflunisal 500 Mg Tabs (Diflunisal) .... Take 1 tablet by mouth two times a day as needed forpain 4)  Prevacid 30 Mg Cpdr (Lansoprazole) .... Take 1 tablet by mouth once a day 5)  Fish Oil 1000 Mg Caps (Omega-3 fatty acids) .... 2 by mouth daily.  Patient Instructions: 1)  It is important that you exercise reguarly at least 20 minutes 5 times a week. If you develop chest pain, have severe difficulty breathing, or feel very tired, stop  exercising immediately and seek medical attention.  2)  Take calcium +vitamin D daily.  3)  We will call you with your lab results.    Orders Added: 1)  Gastroenterology Referral [GI] 2)  Est. Patient age 29-64 [37] 3)  Est. Patient Level III [99213]   Immunization History:  Influenza Immunization History:    Influenza:  historical (04/27/2010)   Immunization History:  Influenza Immunization History:    Influenza:  Historical (04/27/2010)     Immunization History:  Influenza Immunization History:    Influenza:  historical (04/27/2010)

## 2010-10-14 ENCOUNTER — Ambulatory Visit: Payer: Managed Care, Other (non HMO) | Admitting: Family Medicine

## 2010-10-14 ENCOUNTER — Ambulatory Visit (INDEPENDENT_AMBULATORY_CARE_PROVIDER_SITE_OTHER): Payer: Managed Care, Other (non HMO) | Admitting: Family Medicine

## 2010-10-14 ENCOUNTER — Encounter: Payer: Self-pay | Admitting: Family Medicine

## 2010-10-14 DIAGNOSIS — G609 Hereditary and idiopathic neuropathy, unspecified: Secondary | ICD-10-CM

## 2010-10-24 NOTE — Assessment & Plan Note (Signed)
Summary: Foot pain, bilat.    Vital Signs:  Patient profile:   50 year old female Height:      66 inches Weight:      194 pounds Pulse rate:   89 / minute BP sitting:   128 / 78  (right arm) Cuff size:   regular  Vitals Entered By: Avon Gully CMA, Duncan Dull) (October 14, 2010 3:59 PM) CC: discuss neuropathy    Primary Care Provider:  Linford Arnold, c  CC:  discuss neuropathy .  History of Present Illness: Foot pain for several years but feels it has gotten worse.  Now has 2 wear flipflops or shoes to even clean her house. Burning with walking.  Will have to change shoes halfway throught the day even out shoppin for the day.  During kettle bell classes has to take her shoes off and her feet felt like they were going numb. When puts pressure on them will feel pins and needles sensation.  More recently feels some cramping under her toe area. On her the left heel it is painful to walk and put pressure on it.  Says changes out her work out shoes every 6 months.  Brother who passed away at [facility] has similar foot sxs.  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  Did try Dr. Idelle Jo inserts.  No radiaiton into the leg and ankle. No periods of non discomfort. doesn' treally take any pain relievers for it.    Allergies: 1)  ! Sulfa  Physical Exam  General:  Well-developed,well-nourished,in no acute distress; alert,appropriate and cooperative throughout examination  Diabetes Management Exam:    Foot Exam (with socks and/or shoes not present):       Sensory-Monofilament:          Left foot: normal          Right foot: normal       Sensory-other: DP pulse 2+ bilat.        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 If not helping then conisder neurontin or  lyrica. I am not sure of a cuase of hre neuropathy. I don't think this is from her back.  No hx of diabetes.  B12 on the low end of normal.   Orders: Podiatry Referral (Podiatry)  Complete Medication List: 1)  Glucosamine-chondroitin Tabs (Glucosamine-chondroitin tabs) .... Once daily 2)  Calcium 600 1500 Mg Tabs (Calcium carbonate) .... Take one tablet twice a day 3)  Diflunisal 500 Mg Tabs (Diflunisal) .... Take 1 tablet by mouth two times a day as needed forpain 4)  Prevacid 30 Mg Cpdr (Lansoprazole) .... Take 1 tablet by mouth once a day 5)  Fish Oil 1000 Mg Caps (Omega-3 fatty acids) .... 4 by mouth daily. 6)  Metanx 3-35-2 Mg Tabs (L-methylfolate-b6-b12) .... Take 1 tablet by mouth once a day   Patient Instructions: 1)  Please schedule a follow-up appointment in 6-8 weeks for neuropathy.  2)  If the metanx is not helping then we can consider lyrica or neurontin, etc for nerve pain Prescriptions: METANX 3-35-2 MG TABS (  L-METHYLFOLATE-B6-B12) Take 1 tablet by mouth once a day  #30 x 1   Entered and Authorized by:   Nani Gasser MD   Signed by:   Nani Gasser MD on 10/14/2010   Method used:   Electronically to        [facility] [phone]* (retail)       [address] [place], [place]  [address]       Ph: [phone]       Fax: [phone]   RxID:   [id number]    Orders Added: 1)  Podiatry Referral [Podiatry] 2)  Est. Patient Level III [74259]

## 2010-12-03 ENCOUNTER — Other Ambulatory Visit: Payer: Self-pay | Admitting: Family Medicine

## 2011-02-24 ENCOUNTER — Other Ambulatory Visit: Payer: Self-pay | Admitting: *Deleted

## 2011-02-24 NOTE — Telephone Encounter (Signed)
OK to change?

## 2011-02-24 NOTE — Telephone Encounter (Signed)
Pt wants to switch her prevacid to prilosec 40mg  capsules and not tablet due to a change in her ins. Pt needs 90 day supply with 1 yr refill

## 2011-02-25 MED ORDER — OMEPRAZOLE 40 MG PO CPDR: 40.0000 mg | DELAYED_RELEASE_CAPSULE | Freq: Every day | ORAL | Status: DC

## 2011-03-12 ENCOUNTER — Encounter: Payer: Self-pay | Admitting: Family Medicine

## 2011-03-13 ENCOUNTER — Encounter: Payer: Self-pay | Admitting: Family Medicine

## 2011-03-13 ENCOUNTER — Ambulatory Visit (INDEPENDENT_AMBULATORY_CARE_PROVIDER_SITE_OTHER): Payer: Managed Care, Other (non HMO) | Admitting: Family Medicine

## 2011-03-13 VITALS — BP 121/68 | HR 59 | Wt 196.0 lb

## 2011-03-13 DIAGNOSIS — M79609 Pain in unspecified limb: Secondary | ICD-10-CM

## 2011-03-13 DIAGNOSIS — M79646 Pain in unspecified finger(s): Secondary | ICD-10-CM

## 2011-03-13 NOTE — Progress Notes (Signed)
  Subjective:    Patient ID: Tonya Banks, female    DOB: [birth year], 50 y.o.   MRN: [id number]  HPI RT hand middle finger pain for 2 wk. Says pain is between between the PIP and DIP. Not actually hurting over the joint.  Says does have arthritis but this feels different and is more sharp. Yesterday bought a splint OTC and wore it yesterday and feels better today.  No trauma.  Used a topical pain med with no relief.    Review of Systems     Objective:   Physical Exam   Rt Hand:Middle finger with no swelling redness or inc warmth. She is slightly tender posteriorly between PIP and DIP.  Normal flexion and extension.       Assessment & Plan:  Extensor tendonitis over Rt hand, middle finger:  For now recommend continue to wear the splint during the day as it seems to be really helping. If doing well after one week then dec to wearing for half a day. Will get xay if not better in a couple weeks. Cna use OTC NSAID prn.

## 2011-05-02 ENCOUNTER — Telehealth: Payer: Self-pay | Admitting: Family Medicine

## 2011-05-02 MED ORDER — LANSOPRAZOLE 30 MG PO CPDR: 30.0000 mg | DELAYED_RELEASE_CAPSULE | Freq: Every day | ORAL | Status: DC

## 2011-05-02 NOTE — Telephone Encounter (Signed)
Pt called and said she recently changed insurances and her medication prevacid 30 mg had to switched to omeprazole 40 mg and the med is not working and pt wishes to go back to prevacid 30 mg.  The script needs to be sent to [facility] mail order since her new insurance requires her to use mail order pharmacy. PLan:  Routed to Dr. Marlyne Beards, LPN Dom[facility] Dimes

## 2011-05-02 NOTE — Telephone Encounter (Signed)
Pt notified that will change her medication back to prevacid 30 mg and will send new script to [facility] mail order.  LMOM for the pt.

## 2011-05-02 NOTE — Telephone Encounter (Signed)
OK ot change back to prevacid

## 2011-10-28 ENCOUNTER — Other Ambulatory Visit: Payer: Self-pay | Admitting: Family Medicine

## 2011-12-09 ENCOUNTER — Encounter: Payer: Self-pay | Admitting: Family Medicine

## 2011-12-09 ENCOUNTER — Ambulatory Visit (INDEPENDENT_AMBULATORY_CARE_PROVIDER_SITE_OTHER): Payer: BC Managed Care – PPO | Admitting: Family Medicine

## 2011-12-09 VITALS — BP 117/62 | HR 52 | Ht 66.0 in | Wt 182.0 lb

## 2011-12-09 DIAGNOSIS — R0789 Other chest pain: Secondary | ICD-10-CM

## 2011-12-09 DIAGNOSIS — K219 Gastro-esophageal reflux disease without esophagitis: Secondary | ICD-10-CM

## 2011-12-09 DIAGNOSIS — I4510 Unspecified right bundle-branch block: Secondary | ICD-10-CM | POA: Insufficient documentation

## 2011-12-09 DIAGNOSIS — Z0000 Encounter for general adult medical examination without abnormal findings: Secondary | ICD-10-CM

## 2011-12-09 LAB — COMPLETE METABOLIC PANEL WITH GFR
ALT: 13 U/L (ref 0–35)
AST: 14 U/L (ref 0–37)
Alkaline Phosphatase: 64 U/L (ref 39–117)
BUN: 10 mg/dL (ref 6–23)
CO2: 28 mEq/L (ref 19–32)
Chloride: 107 mEq/L (ref 96–112)
Creat: 0.66 mg/dL (ref 0.50–1.10)
GFR, Est Non African American: >89 mL/min
Sodium: 141 mEq/L (ref 135–145)
Total Bilirubin: 0.5 mg/dL (ref 0.3–1.2)

## 2011-12-09 LAB — CBC WITH DIFFERENTIAL/PLATELET
Basophils Absolute: 0 10*3/uL (ref 0.0–0.1)
Eosinophils Absolute: 0.1 10*3/uL (ref 0.0–0.7)
Eosinophils Relative: 1 % (ref 0–5)
HCT: 41 % (ref 36.0–46.0)
Hemoglobin: 13.4 g/dL (ref 12.0–15.0)
MCV: 94 fL (ref 78.0–100.0)
Monocytes Absolute: 0.3 10*3/uL (ref 0.1–1.0)
Platelets: 246 10*3/uL (ref 150–400)
RDW: 12.5 % (ref 11.5–15.5)
WBC: 5.2 10*3/uL (ref 4.0–10.5)

## 2011-12-09 LAB — LIPID PANEL
HDL: 42 mg/dL (ref >39)
Triglycerides: 113 mg/dL (ref <150)
VLDL: 23 mg/dL (ref 0–40)

## 2011-12-09 NOTE — Patient Instructions (Signed)
Start a regular exercise program and make sure you are eating a healthy diet Try to eat 4 servings of dairy a day  Your vaccines are up to date.   

## 2011-12-09 NOTE — Progress Notes (Signed)
Subjective:     Tonya Banks is a 51 y.o. female and is here for a comprehensive physical exam. The patient reports problems - Still haveing frequent heart burn. on prevacid.  Using miralax daily for her bowels.  Says tried prilosec and didn't work well.   she's been on Prevacid for over 10 years. Used to work very well but recently it has not. For the most part she does a good job with her diet and only has an occasional indiscretion.  First week of March was sitting watching TV and felt a sharp shooting pain in her chest. No movement or activity that triggered it.  Lasted 5-10 mintues. No worsening or alleviating symptoms.  No nausea or sweating. Didn't stay sore.  Got up and took 4 baby ASA.  Notices since that happed says her right hand will feel cold. Comes and goes.  Had a normal stress test in 2005.  BP is better this time   History   Social History  . Marital Status: Married    Spouse Name: N/A    Number of Children: 3  . Years of Education: N/A   Occupational History  . Not on file.   Social History Main Topics  . Smoking status: Never Smoker   . Smokeless tobacco: Not on file  . Alcohol Use: 0.0 - 0.5 oz/week    0-1 drink(s) per week     month  . Drug Use: No  . Sexually Active: Yes -- Female partner(s)   Other Topics Concern  . Not on file   Social History Narrative   No regular exercise.     Health Maintenance  Topic Date Due  . Influenza Vaccine  04/27/2012  . Pap Smear  09/26/2013  . Tetanus/tdap  03/22/2017    The following portions of the patient's history were reviewed and updated as appropriate: allergies, current medications, past family history, past medical history, past social history, past surgical history and problem list.  Review of Systems A comprehensive review of systems was negative.   Objective:    BP 117/62  Pulse 52  Ht 5\' 6"  (1.676 m)  Wt 182 lb (82.555 kg)  BMI 29.38 kg/m2  LMP 11/25/2011 General appearance: alert, cooperative,  appears stated age and mildly obese Head: Normocephalic, without obvious abnormality, atraumatic Eyes: PEERLA, EOMi, conjunctiva is clear Ears: normal TM's and external ear canals both ears Nose: Nares normal. Septum midline. Mucosa normal. No drainage or sinus tenderness. Throat: lips, mucosa, and tongue normal; teeth and gums normal Neck: no adenopathy, no carotid bruit, no JVD, supple, symmetrical, trachea midline and thyroid not enlarged, symmetric, no tenderness/mass/nodules Back: symmetric, no curvature. ROM normal. No CVA tenderness. Lungs: clear to auscultation bilaterally Breasts: normal appearance, no masses or tenderness Heart: regular rate and rhythm, S1, S2 normal, no murmur, click, rub or gallop Abdomen: soft, non-tender; bowel sounds normal; no masses,  no organomegaly Extremities: extremities normal, atraumatic, no cyanosis or edema Pulses: 2+ and symmetric Skin: Skin color, texture, turgor normal. No rashes or lesions Lymph nodes: Cervical, supraclavicular, and axillary nodes normal. Neurologic: Alert and oriented X 3, normal strength and tone. Normal symmetric reflexes. Normal coordination and gait    Assessment:    Healthy female exam.      Plan:     See After Visit Summary for Counseling Recommendations  Start a regular exercise program and make sure you are eating a healthy diet Try to eat 4 servings of dairy a day or take a  calcium supplement (500mg  twice a day). Your vaccines are up to date. Will get screenign labs.  Pap smera is up to date. Not due to repeat until 2015.  1. GERD - Will change to Dexilant 60mg . Call if not better in 2 weeks.  We did discuss potential risks of long-term use of PPIs. She said she was aware of these risks. For right now really want to get her symptoms under control and then try to wean her as tolerated if possible. Given a coupon card  2.  Atypical Chest Pain - Will get EKG. EKG shows rate of 50 bpm, NSR, no acute changes. No q  waves. Normal axis and intervals.  RBBB.  No old EKG on file. Will schedule for a treadmill stress test. Could be GERD related but this is not her usual coarse.   3. Cold right hand - will check TSH.  Will check bilateral blood pressure in arm. Could be positional

## 2011-12-10 ENCOUNTER — Encounter: Payer: Self-pay | Admitting: *Deleted

## 2011-12-10 ENCOUNTER — Other Ambulatory Visit: Payer: Self-pay | Admitting: Family Medicine

## 2011-12-10 MED ORDER — PRAVASTATIN SODIUM 20 MG PO TABS: 20.0000 mg | ORAL_TABLET | Freq: Every day | ORAL | Status: DC

## 2011-12-11 ENCOUNTER — Telehealth: Payer: Self-pay | Admitting: *Deleted

## 2011-12-11 NOTE — Telephone Encounter (Signed)
Pt called and wanted to know if it was ok for her to take a coated asa 81 mg a day. Pt also wanted you to know that she is going to wait a week before she takes the chol med since she just started dexilant to make sure she has no reaction with that

## 2011-12-11 NOTE — Telephone Encounter (Signed)
Pt.notified

## 2011-12-11 NOTE — Telephone Encounter (Signed)
Ok to wait a week before stating the chol med.  I want her on teh dexilant for a couple of weeks before starting the ASA which can irritate the stomach.

## 2011-12-29 ENCOUNTER — Encounter: Payer: BC Managed Care – PPO | Admitting: Physician Assistant

## 2011-12-29 ENCOUNTER — Ambulatory Visit (INDEPENDENT_AMBULATORY_CARE_PROVIDER_SITE_OTHER): Payer: BC Managed Care – PPO | Admitting: Nurse Practitioner

## 2011-12-29 ENCOUNTER — Telehealth: Payer: Self-pay | Admitting: *Deleted

## 2011-12-29 DIAGNOSIS — R0789 Other chest pain: Secondary | ICD-10-CM

## 2011-12-29 DIAGNOSIS — I4510 Unspecified right bundle-branch block: Secondary | ICD-10-CM

## 2011-12-29 DIAGNOSIS — R079 Chest pain, unspecified: Secondary | ICD-10-CM

## 2011-12-29 DIAGNOSIS — K219 Gastro-esophageal reflux disease without esophagitis: Secondary | ICD-10-CM

## 2011-12-29 NOTE — Telephone Encounter (Signed)
Pt states that we can refer her to GI. States we sent her last year to Dr. Jason Fila at [facility]. States she will need a few weeks notice for the appt b/c of getting time off at her job. Pt was given samples of Nexium  40mg  to try also.

## 2011-12-29 NOTE — Progress Notes (Signed)
Exercise Treadmill Test  Pre-Exercise Testing Evaluation Rhythm: Sinus Bradycardia, RBBB  Rate: 55   PR:  .15 QRS:  .13  QT:  .45 QTc: .43            Test  Exercise Tolerance Test Ordering MD: Nani Gasser  Interpreting MD: Ward Givens, NP  Unique Test No: 1  Treadmill:  1  Indication for ETT: chest pain - rule out ischemia  Contraindication to ETT: No   Stress Modality: exercise - treadmill  Cardiac Imaging Performed: non   Protocol: standard Bruce - maximal  Max BP:  211/78  Max MPHR (bpm):  171 85% MPR (bpm):  145  MPHR obtained (bpm):  162 % MPHR obtained:  94%  Reached 85% MPHR (min:sec):  10:00 Total Exercise Time (min-sec):  12:22  Workload in METS:  14.0 Borg Scale: 15  Reason ETT Terminated:  dyspnea    ST Segment Analysis At Rest: significant ST depression making ST analysis non-diagnostic - BASELINE RBBB With Exercise: BASELINE RBBB.  NO SIGNIFICANT ST/T CHANGES OTHERWISE.  Other Information Arrhythmia:  occas pvc's. Angina during ETT:  absent (0) Quality of ETT:  non-diagnostic  ETT Interpretation:  ECG interpretation limited by baseline RBBB.  Comments: Excellent exercise tolerance without chest pain.  Baseline RBBB w/o new changes during exercise.  Recommendations: F/U with PCP as scheduled.

## 2012-01-20 ENCOUNTER — Other Ambulatory Visit: Payer: Self-pay | Admitting: Family Medicine

## 2012-02-03 ENCOUNTER — Encounter: Payer: Self-pay | Admitting: *Deleted

## 2012-02-26 ENCOUNTER — Other Ambulatory Visit: Payer: Self-pay | Admitting: *Deleted

## 2012-02-26 ENCOUNTER — Telehealth: Payer: Self-pay | Admitting: *Deleted

## 2012-02-26 DIAGNOSIS — E7800 Pure hypercholesterolemia, unspecified: Secondary | ICD-10-CM

## 2012-02-26 MED ORDER — LANSOPRAZOLE 30 MG PO CPDR: 30.0000 mg | DELAYED_RELEASE_CAPSULE | Freq: Every day | ORAL | Status: DC

## 2012-02-27 LAB — LIPID PANEL
Cholesterol: 155 mg/dL (ref 0–200)
VLDL: 21 mg/dL (ref 0–40)

## 2012-03-18 ENCOUNTER — Other Ambulatory Visit: Payer: Self-pay | Admitting: Family Medicine

## 2012-03-18 DIAGNOSIS — E785 Hyperlipidemia, unspecified: Secondary | ICD-10-CM

## 2012-05-25 ENCOUNTER — Other Ambulatory Visit: Payer: Self-pay | Admitting: *Deleted

## 2012-05-25 MED ORDER — LANSOPRAZOLE 30 MG PO CPDR: 30.0000 mg | DELAYED_RELEASE_CAPSULE | Freq: Every day | ORAL | Status: DC

## 2012-05-27 ENCOUNTER — Other Ambulatory Visit: Payer: Self-pay | Admitting: *Deleted

## 2012-05-27 MED ORDER — LANSOPRAZOLE 30 MG PO CPDR: 30.0000 mg | DELAYED_RELEASE_CAPSULE | Freq: Every day | ORAL | Status: DC

## 2012-08-12 ENCOUNTER — Other Ambulatory Visit: Payer: Self-pay | Admitting: Family Medicine

## 2012-08-12 DIAGNOSIS — Z1231 Encounter for screening mammogram for malignant neoplasm of breast: Secondary | ICD-10-CM

## 2012-08-26 ENCOUNTER — Ambulatory Visit (INDEPENDENT_AMBULATORY_CARE_PROVIDER_SITE_OTHER): Payer: BC Managed Care – PPO

## 2012-08-26 DIAGNOSIS — R928 Other abnormal and inconclusive findings on diagnostic imaging of breast: Secondary | ICD-10-CM

## 2012-08-26 DIAGNOSIS — Z1231 Encounter for screening mammogram for malignant neoplasm of breast: Secondary | ICD-10-CM

## 2012-08-27 ENCOUNTER — Other Ambulatory Visit: Payer: Self-pay | Admitting: Family Medicine

## 2012-08-27 DIAGNOSIS — R928 Other abnormal and inconclusive findings on diagnostic imaging of breast: Secondary | ICD-10-CM

## 2012-08-29 ENCOUNTER — Other Ambulatory Visit: Payer: Self-pay | Admitting: Family Medicine

## 2012-09-08 ENCOUNTER — Encounter: Payer: Self-pay | Admitting: *Deleted

## 2012-09-09 ENCOUNTER — Other Ambulatory Visit: Payer: BC Managed Care – PPO

## 2012-09-14 ENCOUNTER — Ambulatory Visit
Admission: RE | Admit: 2012-09-14 | Discharge: 2012-09-14 | Disposition: A | Discharge status: Home / Self Care | Payer: BC Managed Care – PPO | Source: Ambulatory Visit | Attending: Family Medicine | Admitting: Family Medicine

## 2012-09-14 DIAGNOSIS — R928 Other abnormal and inconclusive findings on diagnostic imaging of breast: Secondary | ICD-10-CM

## 2012-09-16 ENCOUNTER — Encounter: Payer: Self-pay | Admitting: *Deleted

## 2012-09-23 ENCOUNTER — Other Ambulatory Visit: Payer: Self-pay | Admitting: Family Medicine

## 2012-10-06 ENCOUNTER — Telehealth: Payer: Self-pay | Admitting: *Deleted

## 2012-10-06 NOTE — Telephone Encounter (Signed)
Left shoulder area has warm tingly sensation that runs up her neck that started yesterday. No visible bumps however it feels rough, skin looks red. She wasn't sure if it was something contagious. appt made for tomorrow to be checked.Tonya Banks

## 2012-10-07 ENCOUNTER — Encounter: Payer: Self-pay | Admitting: Family Medicine

## 2012-10-07 ENCOUNTER — Ambulatory Visit (INDEPENDENT_AMBULATORY_CARE_PROVIDER_SITE_OTHER): Payer: BC Managed Care – PPO | Admitting: Family Medicine

## 2012-10-07 VITALS — BP 118/70 | HR 60 | Ht 66.0 in | Wt 191.0 lb

## 2012-10-07 DIAGNOSIS — M546 Pain in thoracic spine: Secondary | ICD-10-CM

## 2012-10-07 DIAGNOSIS — E785 Hyperlipidemia, unspecified: Secondary | ICD-10-CM

## 2012-10-07 DIAGNOSIS — M542 Cervicalgia: Secondary | ICD-10-CM

## 2012-10-07 DIAGNOSIS — M549 Dorsalgia, unspecified: Secondary | ICD-10-CM

## 2012-10-07 MED ORDER — PRAVASTATIN SODIUM 20 MG PO TABS: ORAL_TABLET | ORAL | Status: DC

## 2012-10-07 MED ORDER — LIDOCAINE 4 % EX GEL: 1.0000 "application " | CUTANEOUS | Status: DC

## 2012-10-07 MED ORDER — PREDNISONE 20 MG PO TABS: 40.0000 mg | ORAL_TABLET | Freq: Every day | ORAL | Status: DC

## 2012-10-07 MED ORDER — VALACYCLOVIR HCL 1 G PO TABS: 1000.0000 mg | ORAL_TABLET | Freq: Three times a day (TID) | ORAL | Status: DC
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
## 2012-10-07 NOTE — Patient Instructions (Signed)

## 2012-10-14 ENCOUNTER — Telehealth: Payer: Self-pay | Admitting: *Deleted

## 2012-10-14 DIAGNOSIS — M542 Cervicalgia: Secondary | ICD-10-CM

## 2012-10-14 NOTE — Telephone Encounter (Signed)
Patient calls and states that seen last week for ? Shingles and she has finished the prednisone that was given and she never broke out with a rash but did say you had mentioned an MRI for the tingling in neck and she would like to get this done.

## 2012-10-14 NOTE — Telephone Encounter (Signed)
WE can start with xray of the neck (we can't jump to MRI) without initial w/u.  Will place order. She can go anytime.

## 2012-10-15 ENCOUNTER — Ambulatory Visit (INDEPENDENT_AMBULATORY_CARE_PROVIDER_SITE_OTHER): Payer: BC Managed Care – PPO

## 2012-10-15 DIAGNOSIS — M5030 Other cervical disc degeneration, unspecified cervical region: Secondary | ICD-10-CM

## 2012-10-15 DIAGNOSIS — M439 Deforming dorsopathy, unspecified: Secondary | ICD-10-CM

## 2012-10-15 DIAGNOSIS — M542 Cervicalgia: Secondary | ICD-10-CM

## 2012-10-15 NOTE — Telephone Encounter (Signed)
Patient notified and will try and go Monday morning. Barry Dienes, LPN

## 2012-12-28 LAB — COMPLETE METABOLIC PANEL WITH GFR
ALT: 18 U/L (ref 0–35)
Albumin: 3.9 g/dL (ref 3.5–5.2)
Alkaline Phosphatase: 63 U/L (ref 39–117)
CO2: 28 mEq/L (ref 19–32)
GFR, Est Non African American: >89 mL/min
Glucose, Bld: 89 mg/dL (ref 70–99)
Potassium: 4.1 mEq/L (ref 3.5–5.3)
Sodium: 140 mEq/L (ref 135–145)
Total Protein: 6.6 g/dL (ref 6.0–8.3)

## 2012-12-28 LAB — LIPID PANEL
LDL Cholesterol: 109 mg/dL — ABNORMAL HIGH (ref 0–99)
Total CHOL/HDL Ratio: 4.1 Ratio

## 2012-12-30 ENCOUNTER — Encounter: Payer: Self-pay | Admitting: *Deleted

## 2013-01-24 ENCOUNTER — Other Ambulatory Visit: Payer: Self-pay | Admitting: Family Medicine

## 2013-01-25 ENCOUNTER — Other Ambulatory Visit: Payer: Self-pay | Admitting: Family Medicine

## 2013-01-25 DIAGNOSIS — N6310 Unspecified lump in the right breast, unspecified quadrant: Secondary | ICD-10-CM

## 2013-02-23 ENCOUNTER — Other Ambulatory Visit: Payer: Self-pay | Admitting: Family Medicine

## 2013-03-15 ENCOUNTER — Ambulatory Visit
Admission: RE | Admit: 2013-03-15 | Discharge: 2013-03-15 | Disposition: A | Discharge status: Home / Self Care | Payer: BC Managed Care – PPO | Source: Ambulatory Visit | Attending: Family Medicine | Admitting: Family Medicine

## 2013-03-15 DIAGNOSIS — N6310 Unspecified lump in the right breast, unspecified quadrant: Secondary | ICD-10-CM

## 2013-03-28 ENCOUNTER — Other Ambulatory Visit: Payer: Self-pay | Admitting: Family Medicine

## 2013-04-25 ENCOUNTER — Other Ambulatory Visit: Payer: Self-pay | Admitting: Family Medicine

## 2013-05-25 ENCOUNTER — Other Ambulatory Visit: Payer: Self-pay | Admitting: Family Medicine

## 2013-05-29 ENCOUNTER — Other Ambulatory Visit: Payer: Self-pay | Admitting: Family Medicine

## 2013-05-31 ENCOUNTER — Other Ambulatory Visit: Payer: Self-pay | Admitting: *Deleted

## 2013-05-31 MED ORDER — PRAVASTATIN SODIUM 20 MG PO TABS: ORAL_TABLET | ORAL | Status: DC

## 2013-08-08 ENCOUNTER — Other Ambulatory Visit: Payer: Self-pay | Admitting: Family Medicine

## 2013-08-08 DIAGNOSIS — N6310 Unspecified lump in the right breast, unspecified quadrant: Secondary | ICD-10-CM

## 2013-08-22 ENCOUNTER — Ambulatory Visit (INDEPENDENT_AMBULATORY_CARE_PROVIDER_SITE_OTHER): Payer: BC Managed Care – PPO | Admitting: Family Medicine

## 2013-08-22 ENCOUNTER — Other Ambulatory Visit: Payer: Self-pay | Admitting: Family Medicine

## 2013-08-22 ENCOUNTER — Encounter: Payer: Self-pay | Admitting: Family Medicine

## 2013-08-22 ENCOUNTER — Other Ambulatory Visit (HOSPITAL_COMMUNITY)
Admission: RE | Admit: 2013-08-22 | Discharge: 2013-08-22 | Disposition: A | Discharge status: Home / Self Care | Payer: BC Managed Care – PPO | Source: Ambulatory Visit | Attending: Family Medicine | Admitting: Family Medicine

## 2013-08-22 ENCOUNTER — Ambulatory Visit: Payer: BC Managed Care – PPO | Admitting: Family Medicine

## 2013-08-22 VITALS — BP 123/66 | HR 58 | Temp 97.7°F | Ht 65.6 in | Wt 193.0 lb

## 2013-08-22 DIAGNOSIS — Z01419 Encounter for gynecological examination (general) (routine) without abnormal findings: Secondary | ICD-10-CM | POA: Insufficient documentation

## 2013-08-22 DIAGNOSIS — E785 Hyperlipidemia, unspecified: Secondary | ICD-10-CM

## 2013-08-22 DIAGNOSIS — Z124 Encounter for screening for malignant neoplasm of cervix: Secondary | ICD-10-CM

## 2013-08-22 DIAGNOSIS — Z0000 Encounter for general adult medical examination without abnormal findings: Secondary | ICD-10-CM

## 2013-08-22 LAB — LIPID PANEL
CHOL/HDL RATIO: 4.6 ratio
Cholesterol: 205 mg/dL — ABNORMAL HIGH (ref 0–200)
HDL: 45 mg/dL (ref >39)
LDL CALC: 132 mg/dL — AB (ref 0–99)
Triglycerides: 140 mg/dL (ref <150)
VLDL: 28 mg/dL (ref 0–40)

## 2013-08-22 MED ORDER — LANSOPRAZOLE 30 MG PO CPDR: 30.0000 mg | DELAYED_RELEASE_CAPSULE | Freq: Two times a day (BID) | ORAL | Status: DC

## 2013-08-22 MED ORDER — DICLOFENAC SODIUM 2 % TD SOLN: TRANSDERMAL | Status: DC

## 2013-08-22 MED ORDER — LIDOCAINE 4 % EX GEL: 1.0000 "application " | CUTANEOUS | Status: DC

## 2013-08-22 NOTE — Progress Notes (Signed)
  Subjective:     Tonya Banks is a 53 y.o. female and is here for a comprehensive physical exam. The patient reports no problems. She is losing her job in 2-3 month. Company merged and she is losing her position. Went for labs this AM.   History   Social History  . Marital Status: Married    Spouse Name: N/A    Number of Children: 3  . Years of Education: N/A   Occupational History  . Not on file.   Social History Main Topics  . Smoking status: Never Smoker   . Smokeless tobacco: Not on file  . Alcohol Use: 0 - .5 oz/week    0-1 drink(s) per week     Comment: month  . Drug Use: No  . Sexual Activity: Yes    Partners: Male   Other Topics Concern  . Not on file   Social History Narrative   No regular exercise.     Health Maintenance  Topic Date Due  . Influenza Vaccine  02/25/2014  . Mammogram  03/16/2015  . Pap Smear  08/22/2016  . Tetanus/tdap  03/22/2017  . Colonoscopy  09/07/2017    The following portions of the patient's history were reviewed and updated as appropriate: allergies, current medications, past family history, past medical history, past social history, past surgical history and problem list.  Review of Systems A comprehensive review of systems was negative.   Objective:    BP 123/66  Pulse 58  Temp(Src) 97.7 F (36.5 C)  Ht 5' 5.6" (1.666 m)  Wt 193 lb (87.544 kg)  BMI 31.54 kg/m2  SpO2 97%     BP 123/66  Pulse 58  Temp(Src) 97.7 F (36.5 C)  Ht 5' 5.6" (1.666 m)  Wt 193 lb (87.544 kg)  BMI 31.54 kg/m2  SpO2 97% General appearance: alert, cooperative and appears stated age Head: Normocephalic, without obvious abnormality, atraumatic Eyes: conj clear, EOMi, PEERLA Ears: normal TM's and external ear canals both ears Nose: Nares normal. Septum midline. Mucosa normal. No drainage or sinus tenderness. Throat: lips, mucosa, and tongue normal; teeth and gums normal Neck: no adenopathy, no carotid bruit, no JVD, supple, symmetrical,  trachea midline and thyroid not enlarged, symmetric, no tenderness/mass/nodules Back: symmetric, no curvature. ROM normal. No CVA tenderness. Lungs: clear to auscultation bilaterally Breasts: normal appearance, no masses or tenderness Heart: regular rate and rhythm, S1, S2 normal, no murmur, click, rub or gallop Abdomen: soft, non-tender; bowel sounds normal; no masses,  no organomegaly Pelvic: cervix normal in appearance, external genitalia normal, no adnexal masses or tenderness, no cervical motion tenderness, rectovaginal septum normal, uterus normal size, shape, and consistency and vagina normal without discharge Extremities: extremities normal, atraumatic, no cyanosis or edema Pulses: 2+ and symmetric Skin: Skin color, texture, turgor normal. No rashes or lesions Lymph nodes: Cervical, supraclavicular, and axillary nodes normal. Neurologic: Alert and oriented X 3, normal strength and tone. Normal symmetric reflexes. Normal coordination and gait     Assessment:    Healthy female exam.       Plan:     See After Visit Summary for Counseling Recommendations  Keep up a regular exercise program and make sure you are eating a healthy diet Try to eat 4 servings of dairy a day, or if you are lactose intolerant take a calcium with vitamin D daily.  Your vaccines are up to date.  F/U mammo schedule in Feb.

## 2013-08-22 NOTE — Patient Instructions (Signed)
Keep up a regular exercise program and make sure you are eating a healthy diet Try to eat 4 servings of dairy a day, or if you are lactose intolerant take a calcium with vitamin D daily.  Your vaccines are up to date.   

## 2013-09-14 ENCOUNTER — Other Ambulatory Visit: Payer: Self-pay | Admitting: *Deleted

## 2013-09-14 MED ORDER — DICLOFENAC SODIUM 1.5 % TD SOLN: TRANSDERMAL | Status: DC

## 2013-09-23 ENCOUNTER — Encounter: Payer: Self-pay | Admitting: Physician Assistant

## 2013-09-23 ENCOUNTER — Ambulatory Visit (INDEPENDENT_AMBULATORY_CARE_PROVIDER_SITE_OTHER): Payer: BC Managed Care – PPO | Admitting: Physician Assistant

## 2013-09-23 VITALS — BP 148/79 | HR 66 | Wt 190.0 lb

## 2013-09-23 DIAGNOSIS — R071 Chest pain on breathing: Secondary | ICD-10-CM

## 2013-09-23 DIAGNOSIS — M25511 Pain in right shoulder: Secondary | ICD-10-CM

## 2013-09-23 DIAGNOSIS — R0789 Other chest pain: Secondary | ICD-10-CM

## 2013-09-23 DIAGNOSIS — M25519 Pain in unspecified shoulder: Secondary | ICD-10-CM

## 2013-09-23 DIAGNOSIS — R10811 Right upper quadrant abdominal tenderness: Secondary | ICD-10-CM

## 2013-09-23 MED ORDER — SUCRALFATE 1 G PO TABS: 1.0000 g | ORAL_TABLET | Freq: Four times a day (QID) | ORAL | Status: DC

## 2013-09-23 MED ORDER — CYCLOBENZAPRINE HCL 10 MG PO TABS: 10.0000 mg | ORAL_TABLET | Freq: Three times a day (TID) | ORAL | Status: DC | PRN

## 2013-09-23 NOTE — Patient Instructions (Signed)
Will scheduled for RUQ ultrasound.  Continue with tylenol.

## 2013-09-23 NOTE — Progress Notes (Signed)
   Subjective:    Patient ID: Tonya Banks, female    DOB: [birth year], 53 y.o.   MRN: [id number]  HPI Pt presents to the clinic with right chest, back and abdominal pain. She has had similar pain in past about 2 years ago. It almost feels like a pulled muscle because it comes and goes. She had not done anything strenuous or different. For the last 1 and 1/2 week there has been no relief. Pain seems to radiate from right upper abdominal to her right shoulder blade. Still has Gallbladder. Denies any nausea or vomiting. Pain has been pretty constant. Currently on prevacid for 15 years. Not tried anything to make better. Food does not seem to make worse. No fever. Pt has problems with constipation but no recent changes. No jaw pain or CP with exerition. Pt denies any significant anxiety or panic attacks.      Review of Systems     Objective:   Physical Exam  Constitutional: She is oriented to person, place, and time. She appears well-developed and well-nourished.  HENT:  Head: Normocephalic and atraumatic.  Cardiovascular: Normal rate, regular rhythm and normal heart sounds.   Pulmonary/Chest: Effort normal and breath sounds normal.  Tenderness to palpation over right chest wall intercostal.   Abdominal: Soft. Bowel sounds are normal. She exhibits no mass. There is no rebound and no guarding.  Tenderness over RUQ. No tenderness over epigastric or LUQ.   Musculoskeletal:  pain over certain trigger points over right upper back. Normal ROM of neck and right shoulder.  Neurological: She is alert and oriented to person, place, and time.  Psychiatric: She has a normal mood and affect. Her behavior is normal.          Assessment & Plan:  Right chest wall pain/RUQ tenderness/Right shoulder pain- unclear etiology at this point. ekg-sinus bradycardia at 55, RBBB unchanged from last EKG in 2014, PVC's, no ST elevation or depression. I do not think cardiac in cause. There was tenderness to  palpation over right chest wall that is suspicious of musculoskeletal i do not want her to start NSAID due to perphaps GERD and could make symptoms worse. I did give flexeril and symptomatic advice with heat.  Will add carafate and see if any additional benefit. Will get U/S to rule out gallbladder disease. Ordered CBC and cmp to look at liver, kidney etc. Follow up if symptoms not improving or worsening.   Spent 30 minutes with patient and greater than 50 percent of visit spent counseling pt regarding symptoms and treatment plan.

## 2013-10-06 ENCOUNTER — Ambulatory Visit: Payer: BC Managed Care – PPO | Admitting: Family Medicine

## 2013-10-11 ENCOUNTER — Encounter: Payer: Self-pay | Admitting: *Deleted

## 2013-10-12 ENCOUNTER — Ambulatory Visit (INDEPENDENT_AMBULATORY_CARE_PROVIDER_SITE_OTHER): Payer: BC Managed Care – PPO

## 2013-10-12 DIAGNOSIS — R1011 Right upper quadrant pain: Secondary | ICD-10-CM

## 2013-10-12 DIAGNOSIS — R0789 Other chest pain: Secondary | ICD-10-CM

## 2013-10-12 DIAGNOSIS — R10811 Right upper quadrant abdominal tenderness: Secondary | ICD-10-CM

## 2013-10-12 DIAGNOSIS — R141 Gas pain: Secondary | ICD-10-CM

## 2013-10-12 DIAGNOSIS — R142 Eructation: Secondary | ICD-10-CM

## 2013-10-12 DIAGNOSIS — R143 Flatulence: Secondary | ICD-10-CM

## 2013-10-12 DIAGNOSIS — M25511 Pain in right shoulder: Secondary | ICD-10-CM

## 2013-10-12 DIAGNOSIS — R079 Chest pain, unspecified: Secondary | ICD-10-CM

## 2013-10-21 ENCOUNTER — Ambulatory Visit
Admission: RE | Admit: 2013-10-21 | Discharge: 2013-10-21 | Disposition: A | Discharge status: Home / Self Care | Payer: BC Managed Care – PPO | Source: Ambulatory Visit | Attending: Family Medicine | Admitting: Family Medicine

## 2013-10-21 ENCOUNTER — Other Ambulatory Visit: Payer: Self-pay | Admitting: Family Medicine

## 2013-10-21 DIAGNOSIS — N6320 Unspecified lump in the left breast, unspecified quadrant: Secondary | ICD-10-CM

## 2013-10-21 DIAGNOSIS — N6310 Unspecified lump in the right breast, unspecified quadrant: Secondary | ICD-10-CM

## 2013-10-24 ENCOUNTER — Other Ambulatory Visit: Payer: BC Managed Care – PPO

## 2013-10-25 ENCOUNTER — Ambulatory Visit (INDEPENDENT_AMBULATORY_CARE_PROVIDER_SITE_OTHER): Payer: BC Managed Care – PPO | Admitting: Family Medicine

## 2013-10-25 ENCOUNTER — Encounter: Payer: Self-pay | Admitting: Family Medicine

## 2013-10-25 ENCOUNTER — Ambulatory Visit (INDEPENDENT_AMBULATORY_CARE_PROVIDER_SITE_OTHER): Payer: BC Managed Care – PPO

## 2013-10-25 VITALS — BP 131/71 | HR 63 | Wt 191.0 lb

## 2013-10-25 DIAGNOSIS — R071 Chest pain on breathing: Secondary | ICD-10-CM

## 2013-10-25 DIAGNOSIS — R0602 Shortness of breath: Secondary | ICD-10-CM

## 2013-10-25 DIAGNOSIS — R0789 Other chest pain: Secondary | ICD-10-CM

## 2013-10-25 DIAGNOSIS — M940 Chondrocostal junction syndrome [Tietze]: Secondary | ICD-10-CM

## 2013-10-25 MED ORDER — IBUPROFEN 800 MG PO TABS: 800.0000 mg | ORAL_TABLET | Freq: Three times a day (TID) | ORAL | Status: DC | PRN

## 2013-10-25 MED ORDER — METAXALONE 800 MG PO TABS: 800.0000 mg | ORAL_TABLET | Freq: Three times a day (TID) | ORAL | Status: DC

## 2013-10-25 NOTE — Progress Notes (Signed)
   Subjective:    Patient ID: Tonya Banks, female    DOB: 1961/05/03, 53 y.o.   MRN: [id number]  HPI Right sided chest pain and Riht shoulder pain f/u. She had normal [place] and bloodwork.  Went on gas-X ultra bc they saw some gas on the xray film. Says hasn't really helped.  She feels like it is more msucle.  Point to the right side of her chest near the sternum as the main focal area of her pain but does radiates to her back and to towards the axilla at times.  Says laying on her side at night is uncomfortable. She does feel about 50% better.  Feels like a heaviness in her chest when she takes a deep breath. Has been doing the stretches, ice, and the muscle relaxer did help. Pain feels like it is radiating towards the  Back.  Father had lung cancer so she is cancer. Did get some relief with the muscle relaxer but sedating.    Review of Systems     Objective:   Physical Exam  Constitutional: She is oriented to person, place, and time. She appears well-developed and well-nourished.  HENT:  Head: Normocephalic and atraumatic.  Cardiovascular: Normal rate, regular rhythm and normal heart sounds.   Pulmonary/Chest: Effort normal and breath sounds normal.    Focused area of tenderness on exam.    Musculoskeletal:  Shoulder with NROM and strength 5/5 in both UE  Neurological: She is alert and oriented to person, place, and time.  Skin: Skin is warm and dry.  Psychiatric: She has a normal mood and affect. Her behavior is normal.          Assessment & Plan:  Right shoulder and chest wall pain - Based on exam and history most consistent with costochondritis. Discussed dx, treatment option. Recommend NSAID, ice. Avoid heavy lifting. H.O provided.  Will get CXR for assurance since has felt pain with deep breath and she is concerned.  Lungs are clear on exam.  Also trial of skelaxin. Supposedly less sedating than then flexeril. Still warned to be careful.

## 2013-10-28 ENCOUNTER — Other Ambulatory Visit: Payer: Self-pay | Admitting: Family Medicine

## 2013-10-28 ENCOUNTER — Ambulatory Visit
Admission: RE | Admit: 2013-10-28 | Discharge: 2013-10-28 | Disposition: A | Discharge status: Home / Self Care | Payer: BC Managed Care – PPO | Source: Ambulatory Visit | Attending: Family Medicine | Admitting: Family Medicine

## 2013-10-28 DIAGNOSIS — N6320 Unspecified lump in the left breast, unspecified quadrant: Secondary | ICD-10-CM

## 2013-10-31 ENCOUNTER — Ambulatory Visit
Admission: RE | Admit: 2013-10-31 | Discharge: 2013-10-31 | Disposition: A | Discharge status: Home / Self Care | Payer: Self-pay | Source: Ambulatory Visit | Attending: Family Medicine | Admitting: Family Medicine

## 2013-10-31 DIAGNOSIS — N6320 Unspecified lump in the left breast, unspecified quadrant: Secondary | ICD-10-CM

## 2013-11-10 ENCOUNTER — Telehealth: Payer: Self-pay | Admitting: *Deleted

## 2013-11-10 DIAGNOSIS — M25511 Pain in right shoulder: Secondary | ICD-10-CM

## 2013-11-10 NOTE — Telephone Encounter (Signed)
Pt called and stated that she has done all that she was told to do and is still in pain. She wanted to know what the next steps MRI? Please advise.Tonya Banks

## 2013-11-11 NOTE — Telephone Encounter (Signed)
MRI of the right shoulder?

## 2013-11-11 NOTE — Telephone Encounter (Signed)
Called and lvm asking pt to call back.Tonya Banks

## 2013-11-15 ENCOUNTER — Telehealth: Payer: Self-pay | Admitting: *Deleted

## 2013-11-15 NOTE — Telephone Encounter (Signed)
PA obtained for MRI RT shoulder w/o contrast. Auth # [id number]. Exp 12/14/13.  Oscar La, LPN

## 2013-11-16 ENCOUNTER — Other Ambulatory Visit: Payer: Self-pay | Admitting: *Deleted

## 2013-11-16 ENCOUNTER — Other Ambulatory Visit: Payer: Self-pay | Admitting: Family Medicine

## 2013-11-16 MED ORDER — CYCLOBENZAPRINE HCL 10 MG PO TABS: 10.0000 mg | ORAL_TABLET | Freq: Three times a day (TID) | ORAL | Status: DC | PRN

## 2013-11-17 ENCOUNTER — Telehealth: Payer: Self-pay | Admitting: *Deleted

## 2013-11-17 NOTE — Telephone Encounter (Signed)
Pt would like something to take prior to having mri done. Please advise.Tonya Banks

## 2013-11-18 MED ORDER — ALPRAZOLAM 0.5 MG PO TABS: ORAL_TABLET | ORAL | Status: DC

## 2013-11-18 NOTE — Telephone Encounter (Signed)
Ok will fax over xanax to her phamracy

## 2013-11-24 ENCOUNTER — Telehealth: Payer: Self-pay | Admitting: Family Medicine

## 2013-11-24 ENCOUNTER — Telehealth: Payer: Self-pay | Admitting: *Deleted

## 2013-11-24 DIAGNOSIS — R0781 Pleurodynia: Secondary | ICD-10-CM

## 2013-11-24 DIAGNOSIS — R079 Chest pain, unspecified: Secondary | ICD-10-CM

## 2013-11-24 NOTE — Telephone Encounter (Signed)
Pt informed of results and recommendations, she hasn't spoken to anyone from Dr. Earleen Newport' office at this point she will let us know about whether or not their office will order or not. She also stated that the mri that was ordered was only looking at her right shoulder and she would like for them to look at more that just her shoulder she is also  Having pain in her ribcage behind her breast. Please advise she is having this done on Saturday.Tonya Banks

## 2013-11-24 NOTE — Telephone Encounter (Signed)
Please call patient and let her know I did get a copy of the CT that was ordered by Dr. Glennon Hamilton. It was otherwise normal except for some small nodules on the adrenal glands. These plans on top of the kidney. They recommend a repeat adrenal protocol CAT scan in 6 months just to make sure that they're stable and not changing. Please let us know if we need to set this up for her in the fall or if Dr. Glennon Hamilton is going to take care of this.

## 2013-11-24 NOTE — Telephone Encounter (Signed)
We can get a CT of the chest. I will place the order but I do not know if they will be able to do it on the same day as the MRI or not.

## 2013-11-24 NOTE — Telephone Encounter (Signed)
Prior auth obtained for CT Chest with contrast.  Auth # [id number] good from 11/24/13 to 12/23/13.  [facility] notified. Clemetine Marker, LPN

## 2013-11-24 NOTE — Telephone Encounter (Signed)
Pt wanted to know if she should still get the mri?

## 2013-11-25 ENCOUNTER — Ambulatory Visit (INDEPENDENT_AMBULATORY_CARE_PROVIDER_SITE_OTHER): Payer: BC Managed Care – PPO

## 2013-11-25 DIAGNOSIS — R071 Chest pain on breathing: Secondary | ICD-10-CM

## 2013-11-25 DIAGNOSIS — R0781 Pleurodynia: Secondary | ICD-10-CM

## 2013-11-25 DIAGNOSIS — R079 Chest pain, unspecified: Secondary | ICD-10-CM

## 2013-11-25 MED ORDER — IOHEXOL 300 MG/ML  SOLN
75.0000 mL | Freq: Once | INTRAMUSCULAR | Status: AC | PRN
  Administered 2013-11-25: 75 mL via INTRAVENOUS

## 2013-11-25 NOTE — Telephone Encounter (Signed)
lvm informing pt to get CT of chest since her pain seems to be more in her chest and that Dr. Madilyn Fireman recommends that she should make an appt.Tonya Banks

## 2013-11-25 NOTE — Telephone Encounter (Signed)
I think she needs to make another appt.

## 2013-11-26 ENCOUNTER — Ambulatory Visit (HOSPITAL_BASED_OUTPATIENT_CLINIC_OR_DEPARTMENT_OTHER): Payer: BC Managed Care – PPO

## 2013-11-28 ENCOUNTER — Other Ambulatory Visit: Payer: BC Managed Care – PPO

## 2013-12-05 ENCOUNTER — Encounter: Payer: Self-pay | Admitting: Family Medicine

## 2013-12-05 ENCOUNTER — Ambulatory Visit (INDEPENDENT_AMBULATORY_CARE_PROVIDER_SITE_OTHER): Payer: BC Managed Care – PPO | Admitting: Family Medicine

## 2013-12-05 VITALS — BP 139/69 | HR 66 | Wt 193.0 lb

## 2013-12-05 DIAGNOSIS — R071 Chest pain on breathing: Secondary | ICD-10-CM

## 2013-12-05 DIAGNOSIS — R0789 Other chest pain: Secondary | ICD-10-CM

## 2013-12-05 DIAGNOSIS — M549 Dorsalgia, unspecified: Secondary | ICD-10-CM

## 2013-12-05 MED ORDER — IBUPROFEN 800 MG PO TABS: 800.0000 mg | ORAL_TABLET | Freq: Three times a day (TID) | ORAL | Status: DC | PRN

## 2013-12-05 MED ORDER — PREDNISONE 50 MG PO TABS: 50.0000 mg | ORAL_TABLET | Freq: Every day | ORAL | Status: DC

## 2013-12-05 MED ORDER — METAXALONE 800 MG PO TABS
ORAL_TABLET | ORAL | Status: DC
Start: 2013-12-05 — End: 2014-03-22

## 2013-12-05 NOTE — Progress Notes (Signed)
   Subjective:    Patient ID: Tonya Banks, female    DOB: 11-18-1960, 53 y.o.   MRN: [id number]  HPI 6 week follow-up of right upper chest pain.  Radiate behind the right breast and around to her back. She is about 50% better. She is still having to take medication for this.  Had CT last week which was normal.  Riding in the car make it more uncomfortable. She says there is something about putting back up against the seat that aggrevates it.  Taking a deep breath aggrevates her pain.    Review of Systems     Objective:   Physical Exam  Constitutional: She is oriented to person, place, and time. She appears well-developed and well-nourished.  HENT:  Head: Normocephalic and atraumatic.  Cardiovascular: Normal rate, regular rhythm and normal heart sounds.   Pulmonary/Chest: Effort normal and breath sounds normal.      Area of pain and tenderness in red  Musculoskeletal:  Tender over the right upper chest.  See pic.  Shoulder with NROM.   Neurological: She is alert and oriented to person, place, and time.  Skin: Skin is warm and dry.  Psychiatric: She has a normal mood and affect. Her behavior is normal.          Assessment & Plan:  Costochondritis , right upper chest - continue NSAID, muscle relaxer.  50% better. May take 6 more weeks to heal.   Right upper back pain -   Between the shoulder blade and spine.  Likely muscular. Consider could be from the spine as well like a herniated disc that is pinching on the nerve and radiating around to the right ant rib area.  AT this point, def has muscle spams and tenderness on exam to refer to PT for further evaluation nad tx.

## 2013-12-08 ENCOUNTER — Other Ambulatory Visit: Payer: Self-pay | Admitting: Family Medicine

## 2013-12-14 ENCOUNTER — Encounter: Payer: Self-pay | Admitting: Family Medicine

## 2014-01-29 ENCOUNTER — Other Ambulatory Visit: Payer: Self-pay | Admitting: Family Medicine

## 2014-01-30 ENCOUNTER — Telehealth: Payer: Self-pay | Admitting: *Deleted

## 2014-01-30 NOTE — Telephone Encounter (Signed)
Waiting on response from Dr. Madilyn Fireman if it is okay to refill or not. Margette Fast, CMA

## 2014-01-30 NOTE — Telephone Encounter (Signed)
There was a refill request by patient for flexeril. She does not have a future appt scheduled. Is the flexeril necessary to refill prior to appt?  Margette Fast, CMA

## 2014-01-30 NOTE — Telephone Encounter (Signed)
Okay for one refill

## 2014-03-22 ENCOUNTER — Other Ambulatory Visit: Payer: Self-pay | Admitting: Family Medicine

## 2014-04-12 ENCOUNTER — Other Ambulatory Visit: Payer: Self-pay | Admitting: *Deleted

## 2014-04-12 MED ORDER — PRAVASTATIN SODIUM 20 MG PO TABS: ORAL_TABLET | ORAL | Status: DC

## 2014-04-13 ENCOUNTER — Other Ambulatory Visit: Payer: Self-pay | Admitting: *Deleted

## 2014-04-13 MED ORDER — PRAVASTATIN SODIUM 20 MG PO TABS: ORAL_TABLET | ORAL | Status: DC

## 2014-04-19 ENCOUNTER — Ambulatory Visit: Payer: BC Managed Care – PPO

## 2014-05-11 ENCOUNTER — Ambulatory Visit (INDEPENDENT_AMBULATORY_CARE_PROVIDER_SITE_OTHER): Payer: BC Managed Care – PPO | Admitting: Family Medicine

## 2014-05-11 VITALS — Temp 98.4°F

## 2014-05-11 DIAGNOSIS — Z23 Encounter for immunization: Secondary | ICD-10-CM | POA: Diagnosis not present

## 2014-05-11 NOTE — Progress Notes (Signed)
   Subjective:    Patient ID: Tonya Banks, female    DOB: Oct 12, 1960, 53 y.o.   MRN: [id number]  HPI Tonya Banks reports to clinic today for flu shot which she rec'd with out complication. She denies pregnancy or recent fever, illness. She was counseled on the possible side effects of immunization. Margette Fast, CMA    Review of Systems     Objective:   Physical Exam        Assessment & Plan:

## 2014-12-05 ENCOUNTER — Ambulatory Visit (INDEPENDENT_AMBULATORY_CARE_PROVIDER_SITE_OTHER): Payer: BLUE CROSS/BLUE SHIELD | Admitting: Family Medicine

## 2014-12-05 ENCOUNTER — Encounter: Payer: Self-pay | Admitting: Family Medicine

## 2014-12-05 VITALS — BP 125/78 | HR 62 | Wt 180.0 lb

## 2014-12-05 DIAGNOSIS — I781 Nevus, non-neoplastic: Secondary | ICD-10-CM

## 2014-12-05 DIAGNOSIS — M7552 Bursitis of left shoulder: Secondary | ICD-10-CM

## 2014-12-05 DIAGNOSIS — E785 Hyperlipidemia, unspecified: Secondary | ICD-10-CM | POA: Diagnosis not present

## 2014-12-05 DIAGNOSIS — D1801 Hemangioma of skin and subcutaneous tissue: Secondary | ICD-10-CM

## 2014-12-05 DIAGNOSIS — K645 Perianal venous thrombosis: Secondary | ICD-10-CM

## 2014-12-05 MED ORDER — ATORVASTATIN CALCIUM 20 MG PO TABS: 20.0000 mg | ORAL_TABLET | Freq: Every day | ORAL | Status: DC

## 2014-12-05 NOTE — Progress Notes (Signed)
Subjective:    Patient ID: Tonya Banks, female    DOB: [birth year], 54 y.o.   MRN: [id number]  HPI Left shoulder pain - did a lot of heavy lifting in November.  Took IBU and Tylenol and seem to get better.  The lifted a toilet in Feb and ever since then has persistantly bothered her.  Used some icey hot, therapads.  Switched to a side sleeper pillow and hasn't really helped.  .  Painful to sleep on it. Was tender on the top.  Some better.  Seh is right handed.    Feels like she has hemorrhoids. Says either has diarrhea or has constipation.  Does take miralax. They don't each but they feel firm.  Using preparation H for 3-4 weeks. Feels some better than it did a week ago.    Says she has had a whole lot of new cherry angiomas. Wants to know if her liever is ok. Read online it could related to liver.   Hyperlpidemia - She stopped her pravastatin bc of joint aches and pains. Says her body felt tender.      Review of Systems     Objective:   Physical Exam  Constitutional: She is oriented to person, place, and time. She appears well-developed and well-nourished.  HENT:  Head: Normocephalic and atraumatic.  Right Ear: External ear normal.  Left Ear: External ear normal.  Nose: Nose normal.  Eyes: Conjunctivae are normal.  Cardiovascular: Normal rate, regular rhythm and normal heart sounds.   Pulmonary/Chest: Effort normal and breath sounds normal.  Genitourinary:  She has to external thrombosed hemorrhoids. They do not appear to be erythematous or indurated. But they are firm and purple discoloration. Approximately three quarters of a centimeter. Normal center tone.  Musculoskeletal: She exhibits no edema.  Left shoulder with normal range of motion. Normal strength at the shoulder, elbow and wrist. Negative empty can test. No significant discomfort with internal or external rotation. She is tender just over the lateral border of the acromion. Nontender over theacromioclavicular joint  or the clavicle itself. Nontender over the scapula. Normal liftoff test.  Neurological: She is alert and oriented to person, place, and time.  Skin: Skin is warm and dry.  Psychiatric: She has a normal mood and affect. Her behavior is normal.          Assessment & Plan:  Hyperlipidemia - due to recheck lipids. Lab slip provided. She's off the pravastatin because of side effects. Will add that her to her intolerance list. She is wiling to try a different statin so we will try Lipitor.  left shoulder pain- suspect bursitis based on her exam and history today. Given handout on stretches to do on her own at home and encouraged her to start an anti-inflammatory daily for the next week and then use as needed after that. If she's not making some significant progress in pain reduction then consider injection and/or formal physical therapy..    Thrombosed hemorrhoids. Discussed that the most quick treatment that will provide relieffor her would be to lance the hemorrhoids to express the clots. Certainly since they are feeling somewhat better if she wanted to continue the preparation H cream and add sitz baths 2-3 times per day she could do that.  She said she definitely can't miss work right now because she just started a new job. She says she will try to schedule the procedure had the end of this week or next week so that way she can  be off for couple days over the weekend in case they are sore.  Cherry angiomas-gave reassurance. But we'll be happy to check her liver enzymes which need to be done anyway.

## 2014-12-05 NOTE — Patient Instructions (Signed)

## 2014-12-13 LAB — LIPID PANEL
CHOL/HDL RATIO: 2.8 ratio
Cholesterol: 136 mg/dL (ref 0–200)
HDL: 48 mg/dL (ref >=46)
LDL CALC: 74 mg/dL (ref 0–99)
Triglycerides: 71 mg/dL (ref <150)
VLDL: 14 mg/dL (ref 0–40)

## 2014-12-13 LAB — COMPLETE METABOLIC PANEL WITH GFR
ALT: 15 U/L (ref 0–35)
AST: 15 U/L (ref 0–37)
Albumin: 4.5 g/dL (ref 3.5–5.2)
Alkaline Phosphatase: 81 U/L (ref 39–117)
BILIRUBIN TOTAL: 0.5 mg/dL (ref 0.2–1.2)
BUN: 14 mg/dL (ref 6–23)
CALCIUM: 9.4 mg/dL (ref 8.4–10.5)
CHLORIDE: 104 meq/L (ref 96–112)
CO2: 28 mEq/L (ref 19–32)
CREATININE: 0.6 mg/dL (ref 0.50–1.10)
GLUCOSE: 97 mg/dL (ref 70–99)
Potassium: 4.2 mEq/L (ref 3.5–5.3)
Sodium: 139 mEq/L (ref 135–145)
Total Protein: 7 g/dL (ref 6.0–8.3)

## 2014-12-20 ENCOUNTER — Other Ambulatory Visit: Payer: Self-pay

## 2014-12-20 DIAGNOSIS — Z1231 Encounter for screening mammogram for malignant neoplasm of breast: Secondary | ICD-10-CM

## 2014-12-22 ENCOUNTER — Ambulatory Visit: Payer: BLUE CROSS/BLUE SHIELD | Admitting: Family Medicine

## 2015-01-17 ENCOUNTER — Ambulatory Visit
Admission: RE | Admit: 2015-01-17 | Discharge: 2015-01-17 | Disposition: A | Discharge status: Home / Self Care | Payer: BLUE CROSS/BLUE SHIELD | Source: Ambulatory Visit

## 2015-01-17 DIAGNOSIS — Z1231 Encounter for screening mammogram for malignant neoplasm of breast: Secondary | ICD-10-CM

## 2015-01-18 NOTE — Progress Notes (Signed)
Quick Note:  All labs are normal. ______ 

## 2015-02-26 ENCOUNTER — Ambulatory Visit (INDEPENDENT_AMBULATORY_CARE_PROVIDER_SITE_OTHER): Payer: No Typology Code available for payment source | Admitting: Family Medicine

## 2015-02-26 ENCOUNTER — Encounter: Payer: Self-pay | Admitting: Family Medicine

## 2015-02-26 VITALS — BP 128/74 | HR 69 | Ht 66.0 in | Wt 187.0 lb

## 2015-02-26 DIAGNOSIS — D225 Melanocytic nevi of trunk: Secondary | ICD-10-CM | POA: Diagnosis not present

## 2015-02-26 MED ORDER — DICLOFENAC SODIUM 1.5 % TD SOLN: TRANSDERMAL | Status: DC

## 2015-02-26 NOTE — Progress Notes (Signed)
   Subjective:    Patient ID: Tonya Banks, female    DOB: [birth year], 54 y.o.   MRN: [id number]  HPI She has a mole on her mid back that she would like removed. Has been there for months. Extremely itchy.  Not sure if changed in size s she really can't see t well since on her back.   She denies any bleeding. She does take aspirin and fish oil daily.  Review of Systems     Objective:   Physical Exam  Constitutional: She appears well-developed and well-nourished.  Skin: Skin is warm and dry.  She has an approximately 4 mm raised erythematous smooth papule with a little bit of dry scale on top.  Psychiatric: She has a normal mood and affect. Her behavior is normal.          Assessment & Plan:  Atypical nevus - recommend shave biopsy for further evaluation.F/U wound care discussed. concering for superfical basal cell.    Shave Biopsy Procedure Note  Pre-operative Diagnosis: Suspicious lesion  Post-operative Diagnosis: same  Locations: Mid upper back to the left of the spine  Indications: Await pathology  Anesthesia: Lidocaine 1% with epinephrine without added sodium bicarbonate  Procedure Details  Patient informed of the risks (including bleeding and infection) and benefits of the  procedure and Verbal informed consent obtained.  The lesion and surrounding area were given a sterile prep using chlorhexidine and draped in the usual sterile fashion. A scalpel was used to shave an area of skin approximately 35mm by 7mm.  Hemostasis achieved with alumuninum chloride. Antibiotic ointment and a sterile dressing applied.  The specimen was sent for pathologic examination. The patient tolerated the procedure well.  EBL: 0 ml  Findings: Await pathology  Condition: Stable  Complications: none.  Plan: 1. Instructed to keep the wound dry and covered for 24-48h and clean thereafter. 2. Warning signs of infection were reviewed.   3. Recommended that the patient use OTC  acetaminophen as needed for pain.  4. Return if needed

## 2015-02-26 NOTE — Patient Instructions (Signed)
Keep covered until tomorrow. Okay to clean with fingertips with soap and water in the shower. Do not scrub at the area. Apply Vaseline daily. Okay to cover with Band-Aid if desired.

## 2015-03-01 ENCOUNTER — Telehealth: Payer: Self-pay | Admitting: Family Medicine

## 2015-03-01 NOTE — Telephone Encounter (Signed)
Received fax for prior authorization on Diclofenac solution sent through cover my meds waiting on authorization. - CF

## 2015-03-09 NOTE — Telephone Encounter (Signed)
Received fax from [facility] and they denied coverage on Diclofenac due to patient does not have history of failure contraindication or intolerance to at least two prescription strength oral non-steroidal anti-inflammatory drugs, documented swallowing disorder. - CF

## 2015-03-14 ENCOUNTER — Telehealth: Payer: Self-pay | Admitting: Family Medicine

## 2015-03-14 NOTE — Telephone Encounter (Signed)
Call patient: The diclofenac solution for her joints was not covered. They evidently have very strict criteria before they will pay for this. We can certainly try another oral anti-inflammatory instead of he Ibuprofen and see if it's helpful for her. If she's willing to do that then please let me know.

## 2015-03-19 NOTE — Telephone Encounter (Signed)
Pt informed she doesn't really want to take something daily. She wanted to know if this will be daily? Will fwd to pcp for advice.Tonya Banks [place]

## 2015-03-20 MED ORDER — MELOXICAM 15 MG PO TABS: 15.0000 mg | ORAL_TABLET | Freq: Every day | ORAL | Status: DC | PRN

## 2015-03-20 NOTE — Telephone Encounter (Signed)
lvm informing pt of recommendations. .Tonya Banks  

## 2015-03-20 NOTE — Telephone Encounter (Signed)
No she could definitely take it PRN.  Rx sent for mobic

## 2015-04-15 ENCOUNTER — Other Ambulatory Visit: Payer: Self-pay | Admitting: Family Medicine

## 2015-08-09 ENCOUNTER — Ambulatory Visit (INDEPENDENT_AMBULATORY_CARE_PROVIDER_SITE_OTHER): Payer: No Typology Code available for payment source | Admitting: Family Medicine

## 2015-08-09 ENCOUNTER — Encounter: Payer: Self-pay | Admitting: Family Medicine

## 2015-08-09 VITALS — BP 129/70 | HR 67 | Ht 66.0 in | Wt 183.0 lb

## 2015-08-09 DIAGNOSIS — M7552 Bursitis of left shoulder: Secondary | ICD-10-CM

## 2015-08-09 DIAGNOSIS — R21 Rash and other nonspecific skin eruption: Secondary | ICD-10-CM

## 2015-08-09 MED ORDER — PITAVASTATIN CALCIUM 2 MG PO TABS: 1.0000 | ORAL_TABLET | Freq: Every day | ORAL | Status: DC

## 2015-08-09 MED ORDER — CLOBETASOL PROP EMOLLIENT BASE 0.05 % EX CREA: 1.0000 "application " | TOPICAL_CREAM | Freq: Two times a day (BID) | CUTANEOUS | Status: DC

## 2015-08-09 NOTE — Progress Notes (Addendum)
   Subjective:    Patient ID: Tonya Banks, female    DOB: September 17, 1960, 55 y.o.   MRN: [id number]  HPI 55 year old female who comes in today for left shoulder pain. I previously saw her about 7 months ago for left shoulder bursitis. She is coming in today because she would like to have an injection done. Significant pain with abduction and abduction. List painful with flexion and extension. Does use Tylenol and ibuprofen for pain. She denies any neck pain.  Also had some dry thickening of skin under her thumbnail. She says it can be painful and tender at times. She actually used the [place] which she had for toenail fungus on her thumbnail for almost 4-5 months without any improvement in symptom  Review of Systems     Objective:   Physical Exam  Constitutional: She is oriented to person, place, and time. She appears well-developed and well-nourished.  HENT:  Head: Normocephalic and atraumatic.  Eyes: Conjunctivae and EOM are normal.  Cardiovascular: Normal rate.   Pulmonary/Chest: Effort normal.  Musculoskeletal:  She is able to almost fully extend her left shoulder that it was very uncomfortable.. Slightly decreased compared to her right shoulder. Normal internal rotation. Some pain with crossover. Nontender over the shoulder joint itself.  Neurological: She is alert and oriented to person, place, and time.  Skin: Skin is dry. No pallor.  Under the distal edge of the thumbnail going along the borders she has thickened white tissue that appears dry with a very rough textured surface. There is some cracking of the skin as well.  Psychiatric: She has a normal mood and affect. Her behavior is normal.  Vitals reviewed.         Assessment & Plan:  Left shoulder bursitis - she opted for injection for pain relief today. See procedure note below. If she is not feeling better over the next couple weeks and encouraged her follow-up. I would recommend further imaging at that point and possibly  consult was sports medicine provider.  Rash under edge of thumbnail-we'll treat with a topical steroid and see if this improves. It could just be a dermatitis. Also consider that it could be a wart. These will often times day for months or even years before resolving and it does have a thickened white rough textured surface appearance.  Aspiration/Injection Procedure Note Tonya Banks [id number] 08-14-1960  Procedure: Injection Indications: None  Procedure Details Consent: Risks of procedure as well as the alternatives and risks of each were explained to the (patient/caregiver).  Consent for procedure obtained. Time Out: Verified patient identification, verified procedure, site/side was marked, verified correct patient position, special equipment/implants available, medications/allergies/relevent history reviewed, required imaging and test results available.  Performed   Local Anesthesia Used:Ethyl Chloride Spray Amount of Fluid Aspirated: minimal amount INjection: 9 cc lidocaine with 1 cc 40mg  DepoMedrol.  A sterile dressing was applied.  Patient did tolerate procedure well. Estimated blood loss: 0  Tonya Banks 08/09/2015, 5:08 PM

## 2015-08-09 NOTE — Addendum Note (Signed)
Addended by: Beatrice Lecher D on: 08/09/2015 05:27 PM   Modules accepted: Level of Service

## 2015-09-24 ENCOUNTER — Ambulatory Visit (INDEPENDENT_AMBULATORY_CARE_PROVIDER_SITE_OTHER): Payer: No Typology Code available for payment source

## 2015-09-24 ENCOUNTER — Ambulatory Visit (INDEPENDENT_AMBULATORY_CARE_PROVIDER_SITE_OTHER): Payer: No Typology Code available for payment source | Admitting: Family Medicine

## 2015-09-24 DIAGNOSIS — M25512 Pain in left shoulder: Secondary | ICD-10-CM

## 2015-09-24 DIAGNOSIS — D239 Other benign neoplasm of skin, unspecified: Secondary | ICD-10-CM

## 2015-09-24 DIAGNOSIS — C4492 Squamous cell carcinoma of skin, unspecified: Secondary | ICD-10-CM

## 2015-09-24 DIAGNOSIS — L820 Inflamed seborrheic keratosis: Secondary | ICD-10-CM

## 2015-09-24 DIAGNOSIS — D229 Melanocytic nevi, unspecified: Secondary | ICD-10-CM

## 2015-09-24 NOTE — Progress Notes (Signed)
   Subjective:    Patient ID: Tonya Banks, female    DOB: 01-Apr-1961, 55 y.o.   MRN: [id number]  HPI Still having left shoulder pain says after the injection only 20% better. Has a little more mobility but still painful to raise her arm about 90 degrees.  Pain on the top of the shoulder.     She also has a mole on her upper right back that has been catching on her closing getting very irritated. She would like to take a look at it today.  Review of Systems     Objective:   Physical Exam  Constitutional: She is oriented to person, place, and time. She appears well-developed and well-nourished.  HENT:  Head: Normocephalic and atraumatic.  Eyes: Conjunctivae and EOM are normal.  Cardiovascular: Normal rate.   Pulmonary/Chest: Effort normal.  Neurological: She is alert and oriented to person, place, and time.  Skin: Skin is dry. No pallor.  Small approx 0.8cm round approx 0.8 cm rough textured hyperpigmented papule on right upper back.   Psychiatric: She has a normal mood and affect. Her behavior is normal.  Vitals reviewed.         Assessment & Plan:  Shoulder pain-suspect impingement syndrome-unfortunately she did not get a great response to the injection. We'll get x-rays for further evaluation today and consider referral to sports medicine.  Inflamed seborrheic keratosis-shaped biopsy recommended for further evaluation  Shave Biopsy Procedure Note  Pre-operative Diagnosis: looks like inflammed seb keratosis  Post-operative Diagnosis: same  Locations:right upper back  Indications: irriation  Anesthesia: Lidocaine 1% with epinephrine without added sodium bicarbonate  Procedure Details   Patient informed of the risks (including bleeding and infection) and benefits of the  procedure and Verbal informed consent obtained.  The lesion and surrounding area were given a sterile prep using chlorhexidine and draped in the usual sterile fashion. A scalpel was used to  shave an area of skin approximately 0.8cm by 0.8cm.  Hemostasis achieved with alumuninum chloride. Antibiotic ointment and a sterile dressing applied.  The specimen was sent for pathologic examination. The patient tolerated the procedure well.  EBL: 0 ml  Findings: Await pathology  Condition: Stable  Complications: none.  Plan: 1. Instructed to keep the wound dry and covered for 24-48h and clean thereafter. 2. Warning signs of infection were reviewed.   3. Recommended that the patient use OTC acetaminophen as needed for pain.  4. Return PRN.

## 2015-09-26 ENCOUNTER — Encounter: Payer: Self-pay | Admitting: Family Medicine

## 2015-09-26 NOTE — Addendum Note (Signed)
Addended by: Beatrice Lecher D on: 09/26/2015 03:00 PM   Modules accepted: Orders

## 2015-10-01 ENCOUNTER — Ambulatory Visit: Payer: No Typology Code available for payment source | Admitting: Family Medicine

## 2015-10-04 ENCOUNTER — Ambulatory Visit (INDEPENDENT_AMBULATORY_CARE_PROVIDER_SITE_OTHER): Payer: No Typology Code available for payment source | Admitting: Family Medicine

## 2015-10-04 ENCOUNTER — Encounter: Payer: Self-pay | Admitting: Family Medicine

## 2015-10-04 VITALS — BP 137/67 | HR 55 | Wt 185.0 lb

## 2015-10-04 DIAGNOSIS — M25512 Pain in left shoulder: Secondary | ICD-10-CM | POA: Insufficient documentation

## 2015-10-04 NOTE — Patient Instructions (Signed)
Thank you for coming in today. Call or go to the ER if you develop a large red swollen joint with extreme pain or oozing puss.  Return in 4 weeks or so.  Attend PT.   Impingement Syndrome, Rotator Cuff, Bursitis With Rehab Impingement syndrome is a condition that involves inflammation of the tendons of the rotator cuff and the subacromial bursa, that causes pain in the shoulder. The rotator cuff consists of four tendons and muscles that control much of the shoulder and upper arm function. The subacromial bursa is a fluid filled sac that helps reduce friction between the rotator cuff and one of the bones of the shoulder (acromion). Impingement syndrome is usually an overuse injury that causes swelling of the bursa (bursitis), swelling of the tendon (tendonitis), and/or a tear of the tendon (strain). Strains are classified into three categories. Grade 1 strains cause pain, but the tendon is not lengthened. Grade 2 strains include a lengthened ligament, due to the ligament being stretched or partially ruptured. With grade 2 strains there is still function, although the function may be decreased. Grade 3 strains include a complete tear of the tendon or muscle, and function is usually impaired. SYMPTOMS   Pain around the shoulder, often at the outer portion of the upper arm.  Pain that gets worse with shoulder function, especially when reaching overhead or lifting.  Sometimes, aching when not using the arm.  Pain that wakes you up at night.  Sometimes, tenderness, swelling, warmth, or redness over the affected area.  Loss of strength.  Limited motion of the shoulder, especially reaching behind the back (to the back pocket or to unhook bra) or across your body.  Crackling sound (crepitation) when moving the arm.  Biceps tendon pain and inflammation (in the front of the shoulder). Worse when bending the elbow or lifting. CAUSES  Impingement syndrome is often an overuse injury, in which chronic  (repetitive) motions cause the tendons or bursa to become inflamed. A strain occurs when a force is paced on the tendon or muscle that is greater than it can withstand. Common mechanisms of injury include: Stress from sudden increase in duration, frequency, or intensity of training.  Direct hit (trauma) to the shoulder.  Aging, erosion of the tendon with normal use.  Bony bump on shoulder (acromial spur). RISK INCREASES WITH:  Contact sports (football, wrestling, boxing).  Throwing sports (baseball, tennis, volleyball).  Weightlifting and bodybuilding.  Heavy labor.  Previous injury to the rotator cuff, including impingement.  Poor shoulder strength and flexibility.  Failure to warm up properly before activity.  Inadequate protective equipment.  Old age.  Bony bump on shoulder (acromial spur). PREVENTION   Warm up and stretch properly before activity.  Allow for adequate recovery between workouts.  Maintain physical fitness:  Strength, flexibility, and endurance.  Cardiovascular fitness.  Learn and use proper exercise technique. PROGNOSIS  If treated properly, impingement syndrome usually goes away within 6 weeks. Sometimes surgery is required.  RELATED COMPLICATIONS   Longer healing time if not properly treated, or if not given enough time to heal.  Recurring symptoms, that result in a chronic condition.  Shoulder stiffness, frozen shoulder, or loss of motion.  Rotator cuff tendon tear.  Recurring symptoms, especially if activity is resumed too soon, with overuse, with a direct blow, or when using poor technique. TREATMENT  Treatment first involves the use of ice and medicine, to reduce pain and inflammation. The use of strengthening and stretching exercises may help reduce pain  with activity. These exercises may be performed at home or with a therapist. If non-surgical treatment is unsuccessful after more than 6 months, surgery may be advised. After surgery  and rehabilitation, activity is usually possible in 3 months.  MEDICATION  If pain medicine is needed, nonsteroidal anti-inflammatory medicines (aspirin and ibuprofen), or other minor pain relievers (acetaminophen), are often advised.  Do not take pain medicine for 7 days before surgery.  Prescription pain relievers may be given, if your caregiver thinks they are needed. Use only as directed and only as much as you need.  Corticosteroid injections may be given by your caregiver. These injections should be reserved for the most serious cases, because they may only be given a certain number of times. HEAT AND COLD  Cold treatment (icing) should be applied for 10 to 15 minutes every 2 to 3 hours for inflammation and pain, and immediately after activity that aggravates your symptoms. Use ice packs or an ice massage.  Heat treatment may be used before performing stretching and strengthening activities prescribed by your caregiver, physical therapist, or athletic trainer. Use a heat pack or a warm water soak. SEEK MEDICAL CARE IF:   Symptoms get worse or do not improve in 4 to 6 weeks, despite treatment.  New, unexplained symptoms develop. (Drugs used in treatment may produce side effects.) EXERCISES  RANGE OF MOTION (ROM) AND STRETCHING EXERCISES - Impingement Syndrome (Rotator Cuff  Tendinitis, Bursitis) These exercises may help you when beginning to rehabilitate your injury. Your symptoms may go away with or without further involvement from your physician, physical therapist or athletic trainer. While completing these exercises, remember:   Restoring tissue flexibility helps normal motion to return to the joints. This allows healthier, less painful movement and activity.  An effective stretch should be held for at least 30 seconds.  A stretch should never be painful. You should only feel a gentle lengthening or release in the stretched tissue. STRETCH - Flexion, Standing  Stand with good  posture. With an underhand grip on your right / left hand, and an overhand grip on the opposite hand, grasp a broomstick or cane so that your hands are a little more than shoulder width apart.  Keeping your right / left elbow straight and shoulder muscles relaxed, push the stick with your opposite hand, to raise your right / left arm in front of your body and then overhead. Raise your arm until you feel a stretch in your right / left shoulder, but before you have increased shoulder pain.  Try to avoid shrugging your right / left shoulder as your arm rises, by keeping your shoulder blade tucked down and toward your mid-back spine. Hold for __________ seconds.  Slowly return to the starting position. Repeat __________ times. Complete this exercise __________ times per day. STRETCH - Abduction, Supine  Lie on your back. With an underhand grip on your right / left hand and an overhand grip on the opposite hand, grasp a broomstick or cane so that your hands are a little more than shoulder width apart.  Keeping your right / left elbow straight and your shoulder muscles relaxed, push the stick with your opposite hand, to raise your right / left arm out to the side of your body and then overhead. Raise your arm until you feel a stretch in your right / left shoulder, but before you have increased shoulder pain.  Try to avoid shrugging your right / left shoulder as your arm rises, by keeping your  shoulder blade tucked down and toward your mid-back spine. Hold for __________ seconds.  Slowly return to the starting position. Repeat __________ times. Complete this exercise __________ times per day. ROM - Flexion, Active-Assisted  Lie on your back. You may bend your knees for comfort.  Grasp a broomstick or cane so your hands are about shoulder width apart. Your right / left hand should grip the end of the stick, so that your hand is positioned "thumbs-up," as if you were about to shake hands.  Using your  healthy arm to lead, raise your right / left arm overhead, until you feel a gentle stretch in your shoulder. Hold for __________ seconds.  Use the stick to assist in returning your right / left arm to its starting position. Repeat __________ times. Complete this exercise __________ times per day.  ROM - Internal Rotation, Supine   Lie on your back on a firm surface. Place your right / left elbow about 60 degrees away from your side. Elevate your elbow with a folded towel, so that the elbow and shoulder are the same height.  Using a broomstick or cane and your strong arm, pull your right / left hand toward your body until you feel a gentle stretch, but no increase in your shoulder pain. Keep your shoulder and elbow in place throughout the exercise.  Hold for __________ seconds. Slowly return to the starting position. Repeat __________ times. Complete this exercise __________ times per day. STRETCH - Internal Rotation  Place your right / left hand behind your back, palm up.  Throw a towel or belt over your opposite shoulder. Grasp the towel with your right / left hand.  While keeping an upright posture, gently pull up on the towel, until you feel a stretch in the front of your right / left shoulder.  Avoid shrugging your right / left shoulder as your arm rises, by keeping your shoulder blade tucked down and toward your mid-back spine.  Hold for __________ seconds. Release the stretch, by lowering your healthy hand. Repeat __________ times. Complete this exercise __________ times per day. ROM - Internal Rotation   Using an underhand grip, grasp a stick behind your back with both hands.  While standing upright with good posture, slide the stick up your back until you feel a mild stretch in the front of your shoulder.  Hold for __________ seconds. Slowly return to your starting position. Repeat __________ times. Complete this exercise __________ times per day.  STRETCH - Posterior Shoulder  Capsule   Stand or sit with good posture. Grasp your right / left elbow and draw it across your chest, keeping it at the same height as your shoulder.  Pull your elbow, so your upper arm comes in closer to your chest. Pull until you feel a gentle stretch in the back of your shoulder.  Hold for __________ seconds. Repeat __________ times. Complete this exercise __________ times per day. STRENGTHENING EXERCISES - Impingement Syndrome (Rotator Cuff Tendinitis, Bursitis) These exercises may help you when beginning to rehabilitate your injury. They may resolve your symptoms with or without further involvement from your physician, physical therapist or athletic trainer. While completing these exercises, remember:  Muscles can gain both the endurance and the strength needed for everyday activities through controlled exercises.  Complete these exercises as instructed by your physician, physical therapist or athletic trainer. Increase the resistance and repetitions only as guided.  You may experience muscle soreness or fatigue, but the pain or discomfort you are trying  to eliminate should never worsen during these exercises. If this pain does get worse, stop and make sure you are following the directions exactly. If the pain is still present after adjustments, discontinue the exercise until you can discuss the trouble with your clinician.  During your recovery, avoid activity or exercises which involve actions that place your injured hand or elbow above your head or behind your back or head. These positions stress the tissues which you are trying to heal. STRENGTH - Scapular Depression and Adduction   With good posture, sit on a firm chair. Support your arms in front of you, with pillows, arm rests, or on a table top. Have your elbows in line with the sides of your body.  Gently draw your shoulder blades down and toward your mid-back spine. Gradually increase the tension, without tensing the muscles  along the top of your shoulders and the back of your neck.  Hold for __________ seconds. Slowly release the tension and relax your muscles completely before starting the next repetition.  After you have practiced this exercise, remove the arm support and complete the exercise in standing as well as sitting position. Repeat __________ times. Complete this exercise __________ times per day.  STRENGTH - Shoulder Abductors, Isometric  With good posture, stand or sit about 4-6 inches from a wall, with your right / left side facing the wall.  Bend your right / left elbow. Gently press your right / left elbow into the wall. Increase the pressure gradually, until you are pressing as hard as you can, without shrugging your shoulder or increasing any shoulder discomfort.  Hold for __________ seconds.  Release the tension slowly. Relax your shoulder muscles completely before you begin the next repetition. Repeat __________ times. Complete this exercise __________ times per day.  STRENGTH - External Rotators, Isometric  Keep your right / left elbow at your side and bend it 90 degrees.  Step into a door frame so that the outside of your right / left wrist can press against the door frame without your upper arm leaving your side.  Gently press your right / left wrist into the door frame, as if you were trying to swing the back of your hand away from your stomach. Gradually increase the tension, until you are pressing as hard as you can, without shrugging your shoulder or increasing any shoulder discomfort.  Hold for __________ seconds.  Release the tension slowly. Relax your shoulder muscles completely before you begin the next repetition. Repeat __________ times. Complete this exercise __________ times per day.  STRENGTH - Supraspinatus   Stand or sit with good posture. Grasp a __________ weight, or an exercise band or tubing, so that your hand is "thumbs-up," like you are shaking hands.  Slowly  lift your right / left arm in a "V" away from your thigh, diagonally into the space between your side and straight ahead. Lift your hand to shoulder height or as far as you can, without increasing any shoulder pain. At first, many people do not lift their hands above shoulder height.  Avoid shrugging your right / left shoulder as your arm rises, by keeping your shoulder blade tucked down and toward your mid-back spine.  Hold for __________ seconds. Control the descent of your hand, as you slowly return to your starting position. Repeat __________ times. Complete this exercise __________ times per day.  STRENGTH - External Rotators  Secure a rubber exercise band or tubing to a fixed object (table, pole) so that  it is at the same height as your right / left elbow when you are standing or sitting on a firm surface.  Stand or sit so that the secured exercise band is at your uninjured side.  Bend your right / left elbow 90 degrees. Place a folded towel or small pillow under your right / left arm, so that your elbow is a few inches away from your side.  Keeping the tension on the exercise band, pull it away from your body, as if pivoting on your elbow. Be sure to keep your body steady, so that the movement is coming only from your rotating shoulder.  Hold for __________ seconds. Release the tension in a controlled manner, as you return to the starting position. Repeat __________ times. Complete this exercise __________ times per day.  STRENGTH - Internal Rotators   Secure a rubber exercise band or tubing to a fixed object (table, pole) so that it is at the same height as your right / left elbow when you are standing or sitting on a firm surface.  Stand or sit so that the secured exercise band is at your right / left side.  Bend your elbow 90 degrees. Place a folded towel or small pillow under your right / left arm so that your elbow is a few inches away from your side.  Keeping the tension on the  exercise band, pull it across your body, toward your stomach. Be sure to keep your body steady, so that the movement is coming only from your rotating shoulder.  Hold for __________ seconds. Release the tension in a controlled manner, as you return to the starting position. Repeat __________ times. Complete this exercise __________ times per day.  STRENGTH - Scapular Protractors, Standing   Stand arms length away from a wall. Place your hands on the wall, keeping your elbows straight.  Begin by dropping your shoulder blades down and toward your mid-back spine.  To strengthen your protractors, keep your shoulder blades down, but slide them forward on your rib cage. It will feel as if you are lifting the back of your rib cage away from the wall. This is a subtle motion and can be challenging to complete. Ask your caregiver for further instruction, if you are not sure you are doing the exercise correctly.  Hold for __________ seconds. Slowly return to the starting position, resting the muscles completely before starting the next repetition. Repeat __________ times. Complete this exercise __________ times per day. STRENGTH - Scapular Protractors, Supine  Lie on your back on a firm surface. Extend your right / left arm straight into the air while holding a __________ weight in your hand.  Keeping your head and back in place, lift your shoulder off the floor.  Hold for __________ seconds. Slowly return to the starting position, and allow your muscles to relax completely before starting the next repetition. Repeat __________ times. Complete this exercise __________ times per day. STRENGTH - Scapular Protractors, Quadruped  Get onto your hands and knees, with your shoulders directly over your hands (or as close as you can be, comfortably).  Keeping your elbows locked, lift the back of your rib cage up into your shoulder blades, so your mid-back rounds out. Keep your neck muscles relaxed.  Hold  this position for __________ seconds. Slowly return to the starting position and allow your muscles to relax completely before starting the next repetition. Repeat __________ times. Complete this exercise __________ times per day.  STRENGTH - Scapular Retractors  Secure a rubber exercise band or tubing to a fixed object (table, pole), so that it is at the height of your shoulders when you are either standing, or sitting on a firm armless chair.  With a palm down grip, grasp an end of the band in each hand. Straighten your elbows and lift your hands straight in front of you, at shoulder height. Step back, away from the secured end of the band, until it becomes tense.  Squeezing your shoulder blades together, draw your elbows back toward your sides, as you bend them. Keep your upper arms lifted away from your body throughout the exercise.  Hold for __________ seconds. Slowly ease the tension on the band, as you reverse the directions and return to the starting position. Repeat __________ times. Complete this exercise __________ times per day. STRENGTH - Shoulder Extensors   Secure a rubber exercise band or tubing to a fixed object (table, pole) so that it is at the height of your shoulders when you are either standing, or sitting on a firm armless chair.  With a thumbs-up grip, grasp an end of the band in each hand. Straighten your elbows and lift your hands straight in front of you, at shoulder height. Step back, away from the secured end of the band, until it becomes tense.  Squeezing your shoulder blades together, pull your hands down to the sides of your thighs. Do not allow your hands to go behind you.  Hold for __________ seconds. Slowly ease the tension on the band, as you reverse the directions and return to the starting position. Repeat __________ times. Complete this exercise __________ times per day.  STRENGTH - Scapular Retractors and External Rotators   Secure a rubber exercise  band or tubing to a fixed object (table, pole) so that it is at the height as your shoulders, when you are either standing, or sitting on a firm armless chair.  With a palm down grip, grasp an end of the band in each hand. Bend your elbows 90 degrees and lift your elbows to shoulder height, at your sides. Step back, away from the secured end of the band, until it becomes tense.  Squeezing your shoulder blades together, rotate your shoulders so that your upper arms and elbows remain stationary, but your fists travel upward to head height.  Hold for __________ seconds. Slowly ease the tension on the band, as you reverse the directions and return to the starting position. Repeat __________ times. Complete this exercise __________ times per day.  STRENGTH - Scapular Retractors and External Rotators, Rowing   Secure a rubber exercise band or tubing to a fixed object (table, pole) so that it is at the height of your shoulders, when you are either standing, or sitting on a firm armless chair.  With a palm down grip, grasp an end of the band in each hand. Straighten your elbows and lift your hands straight in front of you, at shoulder height. Step back, away from the secured end of the band, until it becomes tense.  Step 1: Squeeze your shoulder blades together. Bending your elbows, draw your hands to your chest, as if you are rowing a boat. At the end of this motion, your hands and elbow should be at shoulder height and your elbows should be out to your sides.  Step 2: Rotate your shoulders, to raise your hands above your head. Your forearms should be vertical and your upper arms should be horizontal.  Hold for __________ seconds.   Slowly ease the tension on the band, as you reverse the directions and return to the starting position. Repeat __________ times. Complete this exercise __________ times per day.  STRENGTH - Scapular Depressors  Find a sturdy chair without wheels, such as a dining room  chair.  Keeping your feet on the floor, and your hands on the chair arms, lift your bottom up from the seat, and lock your elbows.  Keeping your elbows straight, allow gravity to pull your body weight down. Your shoulders will rise toward your ears.  Raise your body against gravity by drawing your shoulder blades down your back, shortening the distance between your shoulders and ears. Although your feet should always maintain contact with the floor, your feet should progressively support less body weight, as you get stronger.  Hold for __________ seconds. In a controlled and slow manner, lower your body weight to begin the next repetition. Repeat __________ times. Complete this exercise __________ times per day.    This information is not intended to replace advice given to you by your health care provider. Make sure you discuss any questions you have with your health care provider.   Document Released: 07/14/2005 Document Revised: 08/04/2014 Document Reviewed: 10/26/2008 [employer] Interactive Patient Education [employer].

## 2015-10-04 NOTE — Progress Notes (Signed)
   Subjective:    I'm seeing this patient as a consultation for:  Dr Madilyn Fireman  CC: Left shoulder pain  HPI: Patient notes a several month history of left shoulder pain. She had a subacromial injection in January 2017 helped a great deal. Her pain decreased from a 12 out of 10 to now 6 out of 10. She continues to note pain with overhand motion and with reaching across. She has pain at night and sleeping as well. She has tried some over-the-counter medicines which have helped a bit.  Past medical history, Surgical history, Family history not pertinant except as noted below, Social history, Allergies, and medications have been entered into the medical record, reviewed, and no changes needed.   Review of Systems: No headache, visual changes, nausea, vomiting, diarrhea, constipation, dizziness, abdominal pain, skin rash, fevers, chills, night sweats, weight loss, swollen lymph nodes, body aches, joint swelling, muscle aches, chest pain, shortness of breath, mood changes, visual or auditory hallucinations.   Objective:    Filed Vitals:   10/04/15 1606  BP: 137/67  Pulse: 55   General: Well Developed, well nourished, and in no acute distress.  Neuro/Psych: Alert and oriented x3, extra-ocular muscles intact, able to move all 4 extremities, sensation grossly intact. Skin: Warm and dry, no rashes noted.  Respiratory: Not using accessory muscles, speaking in full sentences, trachea midline.  Cardiovascular: Pulses palpable, no extremity edema. Abdomen: Does not appear distended. MSK: Left shoulder is normal appearing. Tender to palpation overlying the acromioclavicular joint. Full motion but pain with abduction beyond 120 Mildly positive Hawkins and Neer's test. Crossover arm compression test positive. Negative O'Brien's test. Mildly positive empty can test.  X-ray left shoulder dated 09/24/2015 reviewed  Procedure: Real-time Ultrasound Guided Injection of Left AC joint  Device: GE Logiq E   Images permanently stored and available for review in the ultrasound unit. Verbal informed consent obtained. Discussed risks and benefits of procedure. Warned about infection bleeding damage to structures skin hypopigmentation and fat atrophy among others. Patient expresses understanding and agreement Time-out conducted.  Noted no overlying erythema, induration, or other signs of local infection.  Skin prepped in a sterile fashion.  Local anesthesia: Topical Ethyl chloride.  With sterile technique and under real time ultrasound guidance: 40mg  depomedrol and 1.50ml marcaine injected easily.  Completed without difficulty  Pain immediately resolved suggesting accurate placement of the medication.  Advised to call if fevers/chills, erythema, induration, drainage, or persistent bleeding.  Images permanently stored and available for review in the ultrasound unit.  Impression: Technically successful ultrasound guided injection.   No results found for this or any previous visit (from the past 24 hour(s)). No results found.  Impression and Recommendations:   This case required medical decision making of moderate complexity.

## 2015-10-04 NOTE — Assessment & Plan Note (Signed)
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  Patient had considerable pain relief following acromioclavicular injection. Plan for formal physical therapy home exercises. Recheck in about 4 weeks.

## 2015-12-27 ENCOUNTER — Telehealth: Payer: Self-pay | Admitting: Family Medicine

## 2015-12-27 DIAGNOSIS — E7800 Pure hypercholesterolemia, unspecified: Secondary | ICD-10-CM

## 2015-12-27 DIAGNOSIS — Z0000 Encounter for general adult medical examination without abnormal findings: Secondary | ICD-10-CM

## 2015-12-27 DIAGNOSIS — Z1329 Encounter for screening for other suspected endocrine disorder: Secondary | ICD-10-CM

## 2015-12-27 NOTE — Telephone Encounter (Signed)
Received information from [employer] insurance regarding noncompliance with livalo Rx. Called and spoke with Pt, states she did stop the Rx for "a week or so" then started it back. Pt reports she was having some "episodes of high BP" and wanted to make sure it wasn't from the Livalo Rx. Pt has started taking Rx daily again. She is due for routine physical and blood work. Will place lab orders and Pt is going to call clinic to schedule her annual exam. No further questions.

## 2016-01-02 ENCOUNTER — Other Ambulatory Visit: Payer: Self-pay | Admitting: Family Medicine

## 2016-01-02 DIAGNOSIS — Z1231 Encounter for screening mammogram for malignant neoplasm of breast: Secondary | ICD-10-CM

## 2016-01-03 LAB — COMPLETE METABOLIC PANEL WITH GFR
ALT: 15 U/L (ref 6–29)
AST: 15 U/L (ref 10–35)
Albumin: 4.2 g/dL (ref 3.6–5.1)
Alkaline Phosphatase: 80 U/L (ref 33–130)
BILIRUBIN TOTAL: 0.6 mg/dL (ref 0.2–1.2)
BUN: 14 mg/dL (ref 7–25)
CO2: 28 mmol/L (ref 20–31)
Calcium: 9.3 mg/dL (ref 8.6–10.4)
Chloride: 105 mmol/L (ref 98–110)
Creat: 0.69 mg/dL (ref 0.50–1.05)
GFR, Est African American: >89 mL/min (ref >=60)
GFR, Est Non African American: >89 mL/min (ref >=60)
Glucose, Bld: 97 mg/dL (ref 65–99)
POTASSIUM: 4.2 mmol/L (ref 3.5–5.3)
SODIUM: 140 mmol/L (ref 135–146)
TOTAL PROTEIN: 6.6 g/dL (ref 6.1–8.1)

## 2016-01-03 LAB — TSH: TSH: 1.01 mIU/L

## 2016-01-03 LAB — LIPID PANEL
Cholesterol: 163 mg/dL (ref 125–200)
HDL: 52 mg/dL (ref >=46)
LDL Cholesterol: 91 mg/dL (ref <130)
Total CHOL/HDL Ratio: 3.1 Ratio (ref <=5.0)
Triglycerides: 99 mg/dL (ref <150)
VLDL: 20 mg/dL (ref <30)

## 2016-01-03 NOTE — Telephone Encounter (Signed)
Quick Note:  All labs are normal. ______ 

## 2016-01-21 ENCOUNTER — Ambulatory Visit
Admission: RE | Admit: 2016-01-21 | Discharge: 2016-01-21 | Disposition: A | Discharge status: Home / Self Care | Payer: No Typology Code available for payment source | Source: Ambulatory Visit | Attending: Family Medicine | Admitting: Family Medicine

## 2016-01-21 DIAGNOSIS — Z1231 Encounter for screening mammogram for malignant neoplasm of breast: Secondary | ICD-10-CM

## 2016-02-05 ENCOUNTER — Ambulatory Visit (INDEPENDENT_AMBULATORY_CARE_PROVIDER_SITE_OTHER): Payer: No Typology Code available for payment source | Admitting: Family Medicine

## 2016-02-05 ENCOUNTER — Encounter: Payer: Self-pay | Admitting: Family Medicine

## 2016-02-05 ENCOUNTER — Other Ambulatory Visit: Payer: Self-pay | Admitting: Family Medicine

## 2016-02-05 VITALS — BP 132/64 | HR 65 | Wt 179.0 lb

## 2016-02-05 DIAGNOSIS — G609 Hereditary and idiopathic neuropathy, unspecified: Secondary | ICD-10-CM | POA: Diagnosis not present

## 2016-02-05 DIAGNOSIS — Z0000 Encounter for general adult medical examination without abnormal findings: Secondary | ICD-10-CM

## 2016-02-05 DIAGNOSIS — R252 Cramp and spasm: Secondary | ICD-10-CM | POA: Diagnosis not present

## 2016-02-05 MED ORDER — GABAPENTIN 100 MG PO CAPS
100.0000 mg | ORAL_CAPSULE | Freq: Every day | ORAL | Status: DC
Start: 2016-02-05 — End: 2016-04-07

## 2016-02-05 NOTE — Progress Notes (Signed)
Subjective:     Tonya Banks is a 55 y.o. female and is here for a comprehensive physical exam. The patient reports problems - leg cramps, worse on the right on the anterior shin..  She says it seems to go away when she stops her little low. She had to stop her previous statin because of leg cramps that she was hoping she could take this one.  He says she exercises some but not regularly. Recently they had to put her in-laws into a nursing home and so they have been traveling back and forth to [place] to help clean out her home. She hasn't been sleep sleeping well recently.  Peripheral neuropathy-she feels like it's getting a little bit worse. Her symptoms are bothersome daily but some days it's more intense. It particularly affects the 3 toes on her right foot. She is at the point where she would like to try medication such as gabapentin. She had normal blood work and a normal monofilament exam. She has not had a nerve conduction study.  She is requesting a refill on her Flexeril which she uses very sparingly for her neck and her back. In fact her last refill was in 2015. Visit is sedating says she only uses it at night.  Social History   Social History  . Marital Status: Married    Spouse Name: N/A  . Number of Children: 3  . Years of Education: N/A   Occupational History  . Not on file.   Social History Main Topics  . Smoking status: Never Smoker   . Smokeless tobacco: Not on file  . Alcohol Use: 0.0 - 0.5 oz/week    0-1 drink(s) per week     Comment: month  . Drug Use: No  . Sexual Activity:    Partners: Male   Other Topics Concern  . Not on file   Social History Narrative   No regular exercise.     Health Maintenance  Topic Date Due  . Hepatitis C Screening  Mar 11, 1961  . HIV Screening  07/23/1977  . INFLUENZA VACCINE  02/26/2016  . PAP SMEAR  08/22/2016  . TETANUS/TDAP  03/22/2017  . COLONOSCOPY  09/07/2017  . MAMMOGRAM  01/20/2018    The following  portions of the patient's history were reviewed and updated as appropriate: allergies, current medications, past family history, past medical history, past social history, past surgical history and problem list.  Review of Systems A comprehensive review of systems was negative.   Objective:    BP 132/64 mmHg  Pulse 65  Wt 179 lb (81.194 kg)  SpO2 99%  LMP 11/11/2013 General appearance: alert, cooperative and appears stated age Head: Normocephalic, without obvious abnormality, atraumatic Eyes: conj clear, EOM, PEERLA Ears: normal TM's and external ear canals both ears Nose: Nares normal. Septum midline. Mucosa normal. No drainage or sinus tenderness. Throat: lips, mucosa, and tongue normal; teeth and gums normal Neck: no adenopathy, no carotid bruit, no JVD, supple, symmetrical, trachea midline and thyroid not enlarged, symmetric, no tenderness/mass/nodules Back: symmetric, no curvature. ROM normal. No CVA tenderness. Lungs: clear to auscultation bilaterally Breasts: normal appearance, no masses or tenderness, scars well healed from surgery Heart: regular rate and rhythm, S1, S2 normal, no murmur, click, rub or gallop Abdomen: soft, non-tender; bowel sounds normal; no masses,  no organomegaly Extremities: extremities normal, atraumatic, no cyanosis or edema Pulses: 2+ and symmetric Skin: Skin color, texture, turgor normal. No rashes or lesions Lymph nodes: Cervical, supraclavicular, and axillary  nodes normal. Neurologic: Alert and oriented X 3, normal strength and tone. Normal symmetric reflexes. Normal coordination and gait    Assessment:    Healthy female exam.      Plan:     See After Visit Summary for Counseling Recommendations   Keep up a regular exercise program and make sure you are eating a healthy diet Try to eat 4 servings of dairy a day, or if you are lactose intolerant take a calcium with vitamin D daily.  Your vaccines are up to date.  He does have a 5 year recall  on her colonoscopy so we'll be due in 2019.    Leg cramps-encourage her to try taking her kids have a statin every other day or splitting the tablet in half and taking half a tab nightly. She can see if this makes a difference.  Peripheral neuropathy-discussed next steps in getting an EMG/nerve conduction test versus going ahead and starting medications such as gabapentin for relief. We discussed how the medication works and potential side effects. She would like to move forward with a trial of gabapentin before the the nerve conduction testing. Certainly if her symptoms progress then we will schedule for EMG. We'll start with gabapentin 100 mg at bedtime and can taper up to 3 mg if needed. I will see her back in 6 weeks to see how well she is doing.

## 2016-02-08 ENCOUNTER — Other Ambulatory Visit: Payer: Self-pay | Admitting: *Deleted

## 2016-02-08 MED ORDER — CYCLOBENZAPRINE HCL 10 MG PO TABS: ORAL_TABLET | ORAL | Status: DC

## 2016-03-21 ENCOUNTER — Encounter: Payer: Self-pay | Admitting: Family Medicine

## 2016-03-21 ENCOUNTER — Ambulatory Visit (INDEPENDENT_AMBULATORY_CARE_PROVIDER_SITE_OTHER): Payer: No Typology Code available for payment source | Admitting: Family Medicine

## 2016-03-21 VITALS — BP 128/63 | HR 55 | Wt 182.0 lb

## 2016-03-21 DIAGNOSIS — E7800 Pure hypercholesterolemia, unspecified: Secondary | ICD-10-CM

## 2016-03-21 DIAGNOSIS — G629 Polyneuropathy, unspecified: Secondary | ICD-10-CM | POA: Insufficient documentation

## 2016-03-21 DIAGNOSIS — E559 Vitamin D deficiency, unspecified: Secondary | ICD-10-CM | POA: Diagnosis not present

## 2016-03-21 DIAGNOSIS — R252 Cramp and spasm: Secondary | ICD-10-CM | POA: Diagnosis not present

## 2016-03-21 NOTE — Progress Notes (Signed)
Subjective:    CC:   HPI:  Still getting leg spasm on the livalo. She says it actually got much better when she started taking the Livalo every other day but started back again in a couple of nights ago. Worse at night.  Says even there taking it every other day.  She says the only thing that changed was that she ran out of her over-the-counter potassium that she was taking previously. Typically affects the right leg.  Hyperlipidemia -Taking statin every other day and she says feel better since reducing the dose.  Vit D - she is taking 2000 IU . She wants to know if this is safe to continue. She saw on television where it's possible to take too much vitamin D.  Neuropathy-3 mg the gabapentin at bedtime has really helped her symptoms. She says it really has helped her sleep as well. She is previous leak getting a lot of soreness in her hips and shoulders and feels like the gabapentin has helped until about a week or so ago when she feels like some of her symptoms are returning.  Past medical history, Surgical history, Family history not pertinant except as noted below, Social history, Allergies, and medications have been entered into the medical record, reviewed, and corrections made.   Review of Systems: No fevers, chills, night sweats, weight loss, chest pain, or shortness of breath.   Objective:    General: Well Developed, well nourished, and in no acute distress.  Neuro: Alert and oriented x3, extra-ocular muscles intact, sensation grossly intact.  HEENT: Normocephalic, atraumatic  Skin: Warm and dry, no rashes. Cardiac: Regular rate and rhythm, no murmurs rubs or gallops, no lower extremity edema.  Respiratory: Clear to auscultation bilaterally. Not using accessory muscles, speaking in full sentences.   Impression and Recommendations:    Leg cramps - restart potassium And see if symptoms improve.If not then can decrease livalo to half tab daily.   Hyperlipidemia- 10 you with  Livalo every other day.  Vit D - check levels  Peripheral neuropathy-continue with gabapentin 3 mg at bedtime. She can also consider adding to extract Tylenol bedtime to help with the joint aches and pains and see if this helps as well. If not then we can consider increasing the gabapentin to 400 mg. She did take 40 mg one night and felt like it was really helpful. She will call me and let me know over the next week or 2. Next  Polyarthralgia-really affects her sleep quality. Consider taking 2 extra strength Tylenol bedtime instead of an NSAID.

## 2016-03-22 LAB — VITAMIN D 25 HYDROXY (VIT D DEFICIENCY, FRACTURES): Vit D, 25-Hydroxy: 38 ng/mL (ref 30–100)

## 2016-04-07 ENCOUNTER — Other Ambulatory Visit: Payer: Self-pay | Admitting: *Deleted

## 2016-04-07 MED ORDER — GABAPENTIN 100 MG PO CAPS: 100.0000 mg | ORAL_CAPSULE | Freq: Every day | ORAL | 3 refills | Status: DC

## 2016-07-16 ENCOUNTER — Encounter: Payer: Self-pay | Admitting: Family Medicine

## 2016-07-16 ENCOUNTER — Ambulatory Visit (INDEPENDENT_AMBULATORY_CARE_PROVIDER_SITE_OTHER): Payer: No Typology Code available for payment source | Admitting: Family Medicine

## 2016-07-16 VITALS — BP 155/72 | HR 79 | Temp 99.3°F | Wt 184.0 lb

## 2016-07-16 DIAGNOSIS — J340 Abscess, furuncle and carbuncle of nose: Secondary | ICD-10-CM

## 2016-07-16 MED ORDER — TRAMADOL HCL 50 MG PO TABS: 50.0000 mg | ORAL_TABLET | Freq: Three times a day (TID) | ORAL | 0 refills | Status: DC | PRN

## 2016-07-16 MED ORDER — CEFUROXIME AXETIL 500 MG PO TABS: 500.0000 mg | ORAL_TABLET | Freq: Two times a day (BID) | ORAL | 0 refills | Status: DC

## 2016-07-16 MED ORDER — MUPIROCIN CALCIUM 2 % NA OINT: 1.0000 "application " | TOPICAL_OINTMENT | Freq: Two times a day (BID) | NASAL | 0 refills | Status: DC

## 2016-07-16 MED ORDER — DOXYCYCLINE HYCLATE 100 MG PO TABS: 100.0000 mg | ORAL_TABLET | Freq: Two times a day (BID) | ORAL | 0 refills | Status: DC

## 2016-07-16 NOTE — Progress Notes (Signed)
Tonya Banks is a 55 y.o. female who presents to [facility]: [place] today for pain redness and swelling. Patient notes painful red swollen nose starting a day ago. She's tried topical treatment with Neosporin which has not helped. She denies any abscesses or pimples in her nostrils. She denies fevers or chills. She does note a runny nose starting this morning as well. This is associated with mild nasal congestion and cough. She thinks is getting a cold. She notes the pain is very significant and painful.   No past medical history on file. Past Surgical History:  Procedure Laterality Date  . APPENDECTOMY    . breast lift  2011  . BREAST SURGERY    . TUBAL LIGATION    . tummy tuck    . uterine ablation  2000   Social History  Substance Use Topics  . Smoking status: Never Smoker  . Smokeless tobacco: Not on file  . Alcohol use 0.0 - 0.5 oz/week    0 - 1 drink(s) per week     Comment: month   family history includes Cancer in her father; Gout in her father; Hypertension in her mother.  ROS as above:  Medications: Current Outpatient Prescriptions  Medication Sig Dispense Refill  . Ascorbic Acid (VITAMIN C PO) Take by mouth.      . calcium carbonate (OS-CAL) 600 MG TABS Take 600 mg by mouth 2 (two) times daily with a meal.      . Cholecalciferol (VITAMIN D PO) Take by mouth.    . Clobetasol Prop Emollient Base (CLOBETASOL PROPIONATE E) 0.05 % emollient cream Apply 1 application topically 2 (two) times daily. 30 g 0  . Coenzyme Q10 (CO Q 10 PO) Take 1 tablet by mouth daily.    . cyclobenzaprine (FLEXERIL) 10 MG tablet TAKE 1 TABLET 3 TIMES A DAY AS NEEDED SPASMS 30 tablet 0  . fish oil-omega-3 fatty acids 1000 MG capsule Take 4 g by mouth daily.      Marland Kitchen gabapentin (NEURONTIN) 100 MG capsule Take 1-3 capsules (100-300 mg total) by mouth at bedtime. 270 capsule 3  .  ibuprofen (ADVIL,MOTRIN) 800 MG tablet Take 1 tablet (800 mg total) by mouth every 8 (eight) hours as needed. 60 tablet 0  . lansoprazole (PREVACID) 30 MG capsule Take 1 capsule (30 mg total) by mouth 2 (two) times daily before a meal. 90 capsule 2  . meloxicam (MOBIC) 15 MG tablet Take 1 tablet (15 mg total) by mouth daily as needed for pain. 30 tablet 3  . Pitavastatin Calcium (LIVALO) 2 MG TABS Take 1 tablet (2 mg total) by mouth at bedtime. 30 tablet 11  . POTASSIUM PO Take by mouth.    . vitamin C (ASCORBIC ACID) 500 MG tablet Take 500 mg by mouth daily.     No current facility-administered medications for this visit.    Allergies  Allergen Reactions  . Dexilant [Dexlansoprazole]     Sharp pains on stomach  . Pravastatin Other (See Comments)    msucle aches   . Sulfonamide Derivatives Other (See Comments)    Stomach pain.     Health Maintenance Health Maintenance  Topic Date Due  . Hepatitis C Screening  1961/02/23  . HIV Screening  07/23/1977  . INFLUENZA VACCINE  02/26/2016  . PAP SMEAR  08/22/2016  . TETANUS/TDAP  03/22/2017  . COLONOSCOPY  09/07/2017  . MAMMOGRAM  01/20/2018  Exam:  BP (!) 155/72   Pulse 79   Temp 99.3 F (37.4 C) (Oral)   Wt 184 lb (83.5 kg)   LMP 11/11/2013   SpO2 99%   BMI 29.70 kg/m  Gen: Well NAD HEENT: EOMI,  MMM Tip of the nose is erythematous and tender without any fluctuance. No abscesses are visible in the nostrils. Nasal discharge is present Lungs: Normal work of breathing. CTABL Heart: RRR no MRG Abd: NABS, Soft. Nondistended, Nontender Exts: Brisk capillary refill, warm and well perfused.    No results found for this or any previous visit (from the past 72 hour(s)). No results found.    Assessment and Plan: 55 y.o. female with a blueberry no swelling to be cellulitis. Will treat with oral doxycycline and Ceftin as well as topical mupirocin ointment. Tramadol for pain control. Return as needed    No orders of the  defined types were placed in this encounter.   Discussed warning signs or symptoms. Please see discharge instructions. Patient expresses understanding.

## 2016-07-29 ENCOUNTER — Telehealth: Payer: Self-pay | Admitting: *Deleted

## 2016-07-29 MED ORDER — FLUCONAZOLE 150 MG PO TABS: 150.0000 mg | ORAL_TABLET | Freq: Once | ORAL | 1 refills | Status: AC

## 2016-07-29 NOTE — Telephone Encounter (Signed)
Pt called stating that she was treated for cellulitis and now has a yeast infection. She has tried monistat OTC x 1 wk  and this has not helped. She would like a rx for something to be sent into her pharmacy to treat this.  rx sent.Marland KitchenMarland KitchenAudelia Hives [place]

## 2016-08-15 ENCOUNTER — Other Ambulatory Visit: Payer: Self-pay | Admitting: *Deleted

## 2016-08-15 NOTE — Progress Notes (Unsigned)
cortisporin

## 2016-08-18 ENCOUNTER — Other Ambulatory Visit: Payer: Self-pay | Admitting: *Deleted

## 2016-08-18 MED ORDER — BACIT-POLY-NEO HC 1 % EX OINT: 1.0000 "application " | TOPICAL_OINTMENT | Freq: Two times a day (BID) | CUTANEOUS | 0 refills | Status: DC

## 2016-08-18 NOTE — Progress Notes (Signed)
cortis

## 2016-08-19 ENCOUNTER — Other Ambulatory Visit: Payer: Self-pay | Admitting: *Deleted

## 2016-08-29 ENCOUNTER — Other Ambulatory Visit: Payer: Self-pay | Admitting: Family Medicine

## 2016-12-19 ENCOUNTER — Other Ambulatory Visit: Payer: Self-pay | Admitting: Family Medicine

## 2016-12-19 DIAGNOSIS — Z1231 Encounter for screening mammogram for malignant neoplasm of breast: Secondary | ICD-10-CM

## 2017-01-21 ENCOUNTER — Ambulatory Visit
Admission: RE | Admit: 2017-01-21 | Discharge: 2017-01-21 | Disposition: A | Discharge status: Home / Self Care | Payer: No Typology Code available for payment source | Source: Ambulatory Visit | Attending: Family Medicine | Admitting: Family Medicine

## 2017-01-21 DIAGNOSIS — Z1231 Encounter for screening mammogram for malignant neoplasm of breast: Secondary | ICD-10-CM

## 2017-01-30 ENCOUNTER — Ambulatory Visit (INDEPENDENT_AMBULATORY_CARE_PROVIDER_SITE_OTHER): Payer: No Typology Code available for payment source | Admitting: Family Medicine

## 2017-01-30 ENCOUNTER — Other Ambulatory Visit: Payer: Self-pay

## 2017-01-30 ENCOUNTER — Other Ambulatory Visit: Payer: Self-pay | Admitting: Family Medicine

## 2017-01-30 VITALS — BP 150/79 | HR 66 | Resp 16 | Wt 184.7 lb

## 2017-01-30 DIAGNOSIS — Z630 Problems in relationship with spouse or partner: Secondary | ICD-10-CM | POA: Diagnosis not present

## 2017-01-30 DIAGNOSIS — Z202 Contact with and (suspected) exposure to infections with a predominantly sexual mode of transmission: Secondary | ICD-10-CM | POA: Diagnosis not present

## 2017-01-30 MED ORDER — DICLOFENAC SODIUM 2 % TD SOLN: TRANSDERMAL | 3 refills | Status: DC

## 2017-01-30 NOTE — Progress Notes (Signed)
Subjective:    Patient ID: Tonya Banks, female    DOB: [birth year], 56 y.o.   MRN: [id number]  HPI 55 year old female comes in today with requesting STD testing. She said she just found out last evening from her husband that he was recently diagnosed with gonorrhea. He travels out of the country frequently for his job. He was more recently in [place] and then [place]. He claims that he got for gonorrhea at a bath house in [place]. Several times after he has come home from travels and trips he is reported that he hasn't felt well and that he's been sick and went to the doctor and received antibiotics. She herself denies any new or abnormal symptoms. She does report urinary frequency but says she's had that since having her children. She's also had pain with intercourse but she says she's had that ever since she started going through menopause so it's not new. She denies any recent changes in vaginal discharge. She does have oral sex with her husband so would like her throat swabbed.   Review of Systems  BP (!) 184/82   Pulse 66   Resp 16   Wt 184 lb 11.2 oz (83.8 kg)   LMP 11/11/2013   BMI 29.81 kg/m     Allergies  Allergen Reactions  . Dexilant [Dexlansoprazole]     Sharp pains on stomach  . Pravastatin Other (See Comments)    msucle aches   . Sulfonamide Derivatives Other (See Comments)    Stomach pain.     No past medical history on file.  Past Surgical History:  Procedure Laterality Date  . APPENDECTOMY    . BREAST BIOPSY Left    stereo 2017  . breast lift  2011  . BREAST SURGERY    . REDUCTION MAMMAPLASTY Bilateral 1992  . TUBAL LIGATION    . tummy tuck    . uterine ablation  2000    Social History   Social History  . Marital status: Married    Spouse name: N/A  . Number of children: 3  . Years of education: N/A   Occupational History  . Not on file.   Social History Main Topics  . Smoking status: Never Smoker  . Smokeless tobacco: Not on file  . Alcohol  use 0.0 - 0.5 oz/week    0 - 1 drink(s) per week     Comment: month  . Drug use: No  . Sexual activity: Yes    Partners: Male   Other Topics Concern  . Not on file   Social History Narrative   No regular exercise.      Family History  Problem Relation Age of Onset  . Cancer Father        mesothelioma  . Gout Father   . Hypertension Mother     Outpatient Encounter Prescriptions as of 01/30/2017  Medication Sig  . bacitracin-neomycin-polymyxin-hydrocortisone (CORTISPORIN) 1 % ointment Apply 1 application topically 2 (two) times daily.  . calcium carbonate (OS-CAL) 600 MG TABS Take 600 mg by mouth 2 (two) times daily with a meal.    . Cholecalciferol (VITAMIN D PO) Take by mouth.  . Clobetasol Prop Emollient Base (CLOBETASOL PROPIONATE E) 0.05 % emollient cream Apply 1 application topically 2 (two) times daily.  . Coenzyme Q10 (CO Q 10 PO) Take 1 tablet by mouth daily.  . cyclobenzaprine (FLEXERIL) 10 MG tablet TAKE 1 TABLET 3 TIMES A DAY AS NEEDED SPASMS  . fish oil-omega-3 fatty acids  1000 MG capsule Take 4 g by mouth daily.    Marland Kitchen gabapentin (NEURONTIN) 100 MG capsule Take 1-3 capsules (100-300 mg total) by mouth at bedtime.  Marland Kitchen ibuprofen (ADVIL,MOTRIN) 800 MG tablet Take 1 tablet (800 mg total) by mouth every 8 (eight) hours as needed.  . lansoprazole (PREVACID) 30 MG capsule Take 1 capsule (30 mg total) by mouth 2 (two) times daily before a meal.  . LIVALO 2 MG TABS TAKE 1 TABLET (2 MG TOTAL) BY MOUTH AT BEDTIME.  Marland Kitchen POTASSIUM PO Take by mouth.  . vitamin C (ASCORBIC ACID) 500 MG tablet Take 500 mg by mouth daily.  . [DISCONTINUED] cefUROXime (CEFTIN) 500 MG tablet Take 1 tablet (500 mg total) by mouth 2 (two) times daily with a meal.  . [DISCONTINUED] doxycycline (VIBRA-TABS) 100 MG tablet Take 1 tablet (100 mg total) by mouth 2 (two) times daily.  . [DISCONTINUED] meloxicam (MOBIC) 15 MG tablet Take 1 tablet (15 mg total) by mouth daily as needed for pain.  . [DISCONTINUED]  mupirocin nasal ointment (BACTROBAN NASAL) 2 % Place 1 application into the nose 2 (two) times daily. Use one-half of tube in each nostril twice daily for five (5) days. After application, press sides of nose together and gently massage.  . [DISCONTINUED] traMADol (ULTRAM) 50 MG tablet Take 1 tablet (50 mg total) by mouth every 8 (eight) hours as needed.   No facility-administered encounter medications on file as of 01/30/2017.          Objective:   Physical Exam  Constitutional: She is oriented to person, place, and time. She appears well-developed and well-nourished.  HENT:  Head: Normocephalic and atraumatic.  Eyes: Conjunctivae and EOM are normal.  Cardiovascular: Normal rate.   Pulmonary/Chest: Effort normal.  Neurological: She is alert and oriented to person, place, and time.  Skin: Skin is dry. No pallor.  Psychiatric: She has a normal mood and affect. Her behavior is normal.  Vitals reviewed.      Assessment & Plan:  STD exposure - Full work up.  We'll do urine gonorrhea and chlamydia today. Blood work for HIV syphilis and hepatitis C. It sounds like she artery has a history of herpes so will not test for antibodies to that today. We'll also do a wet prep just to check for BV and yeast even though these are not as STDs. He is actually due for her physical at the end of the month and didn't want to wait until that appointment. She will be due for her Pap smear at that point.  Marital problems - I did encourage her to call me if she feels like she's having a lot of difficulty or if she would be interested in therapy or counseling. At this point I think she still just feeling overwhelmed and not sure what to do.

## 2017-01-30 NOTE — Patient Instructions (Signed)
Can check with insurance on frequency of STD testing that is covered under her plan:  HIV, Hep C, RPR, gonorrhea, chlamydia

## 2017-01-31 LAB — WET PREP, GENITAL
Clue Cells Wet Prep HPF POC: NONE SEEN
Trich, Wet Prep: NONE SEEN
Yeast Wet Prep HPF POC: NONE SEEN

## 2017-01-31 LAB — RPR

## 2017-01-31 LAB — HEPATITIS C ANTIBODY: HCV Ab: NEGATIVE

## 2017-01-31 LAB — HIV ANTIBODY (ROUTINE TESTING W REFLEX): HIV 1&2 Ab, 4th Generation: NONREACTIVE

## 2017-02-02 LAB — GC/CHLAMYDIA PROBE AMP
CT Probe RNA: NOT DETECTED
GC PROBE AMP APTIMA: NOT DETECTED

## 2017-02-02 LAB — NEISSERIA GONORRHOEAE, PROBE AMP: GC Probe RNA: DETECTED — AB

## 2017-02-03 ENCOUNTER — Ambulatory Visit (INDEPENDENT_AMBULATORY_CARE_PROVIDER_SITE_OTHER): Payer: No Typology Code available for payment source | Admitting: Family Medicine

## 2017-02-03 DIAGNOSIS — A549 Gonococcal infection, unspecified: Secondary | ICD-10-CM

## 2017-02-03 MED ORDER — CEFTRIAXONE SODIUM 250 MG IJ SOLR
250.0000 mg | Freq: Once | INTRAMUSCULAR | Status: AC
  Administered 2017-02-03: 250 mg via INTRAMUSCULAR

## 2017-02-03 MED ORDER — AZITHROMYCIN 250 MG PO TABS
1000.0000 mg | ORAL_TABLET | Freq: Once | ORAL | Status: AC
  Administered 2017-02-03: 1000 mg via ORAL

## 2017-02-03 NOTE — Progress Notes (Signed)
Agree with above. Retest for clearance in 3 months for gonorrhea.

## 2017-02-03 NOTE — Progress Notes (Signed)
Pt here to receive STD treatment. Pt given 250 mg Rocephin injection in RUOQ and 1,000 G Azithromycin orally. I had pt to sit for 15 mins before leaving to watch for any adverse reactions and to recheck bp since this was elevated.Audelia Hives [place]

## 2017-02-04 ENCOUNTER — Other Ambulatory Visit: Payer: Self-pay | Admitting: *Deleted

## 2017-02-04 MED ORDER — DICLOFENAC SODIUM 1 % TD GEL
1.0000 | Freq: Four times a day (QID) | TRANSDERMAL | 3 refills | Status: DC
Start: 2017-02-04 — End: 2017-10-01

## 2017-02-04 NOTE — Progress Notes (Signed)
Patient has been informed. Donaldson Richter,CMA  

## 2017-02-12 ENCOUNTER — Encounter: Payer: Self-pay | Admitting: Family Medicine

## 2017-02-12 ENCOUNTER — Ambulatory Visit (INDEPENDENT_AMBULATORY_CARE_PROVIDER_SITE_OTHER): Payer: No Typology Code available for payment source | Admitting: Family Medicine

## 2017-02-12 ENCOUNTER — Other Ambulatory Visit (HOSPITAL_COMMUNITY)
Admission: RE | Admit: 2017-02-12 | Discharge: 2017-02-12 | Disposition: A | Discharge status: Home / Self Care | Payer: No Typology Code available for payment source | Source: Ambulatory Visit | Attending: Family Medicine | Admitting: Family Medicine

## 2017-02-12 VITALS — BP 130/71 | HR 62 | Ht 66.0 in | Wt 180.0 lb

## 2017-02-12 DIAGNOSIS — Z0000 Encounter for general adult medical examination without abnormal findings: Secondary | ICD-10-CM | POA: Insufficient documentation

## 2017-02-12 DIAGNOSIS — N841 Polyp of cervix uteri: Secondary | ICD-10-CM

## 2017-02-12 DIAGNOSIS — Z202 Contact with and (suspected) exposure to infections with a predominantly sexual mode of transmission: Secondary | ICD-10-CM | POA: Diagnosis not present

## 2017-02-12 DIAGNOSIS — Z124 Encounter for screening for malignant neoplasm of cervix: Secondary | ICD-10-CM | POA: Insufficient documentation

## 2017-02-12 LAB — COMPLETE METABOLIC PANEL WITH GFR
ALT: 17 U/L (ref 6–29)
AST: 18 U/L (ref 10–35)
Albumin: 4.6 g/dL (ref 3.6–5.1)
Alkaline Phosphatase: 91 U/L (ref 33–130)
BUN: 14 mg/dL (ref 7–25)
CHLORIDE: 104 mmol/L (ref 98–110)
CO2: 27 mmol/L (ref 20–31)
CREATININE: 0.66 mg/dL (ref 0.50–1.05)
Calcium: 9.5 mg/dL (ref 8.6–10.4)
Glucose, Bld: 101 mg/dL — ABNORMAL HIGH (ref 65–99)
POTASSIUM: 4.5 mmol/L (ref 3.5–5.3)
Sodium: 142 mmol/L (ref 135–146)
Total Bilirubin: 0.6 mg/dL (ref 0.2–1.2)
Total Protein: 7.2 g/dL (ref 6.1–8.1)

## 2017-02-12 LAB — LIPID PANEL W/REFLEX DIRECT LDL
CHOL/HDL RATIO: 3.3 ratio (ref <5.0)
CHOLESTEROL: 182 mg/dL (ref <200)
HDL: 56 mg/dL (ref >50)
LDL-Cholesterol: 106 mg/dL — ABNORMAL HIGH
NON-HDL CHOLESTEROL (CALC): 126 mg/dL (ref <130)
TRIGLYCERIDES: 102 mg/dL (ref <150)

## 2017-02-12 NOTE — Patient Instructions (Addendum)
Preventive Care 40-64 Years, Female Preventive care refers to lifestyle choices and visits with your health care provider that can promote health and wellness. What does preventive care include?  A yearly physical exam. This is also called an annual well check.  Dental exams once or twice a year.  Routine eye exams. Ask your health care provider how often you should have your eyes checked.  Personal lifestyle choices, including: ? Daily care of your teeth and gums. ? Regular physical activity. ? Eating a healthy diet. ? Avoiding tobacco and drug use. ? Limiting alcohol use. ? Practicing safe sex. ? Taking low-dose aspirin daily starting at age 58. ? Taking vitamin and mineral supplements as recommended by your health care provider. What happens during an annual well check? The services and screenings done by your health care provider during your annual well check will depend on your age, overall health, lifestyle risk factors, and family history of disease. Counseling Your health care provider may ask you questions about your:  Alcohol use.  Tobacco use.  Drug use.  Emotional well-being.  Home and relationship well-being.  Sexual activity.  Eating habits.  Work and work Statistician.  Method of birth control.  Menstrual cycle.  Pregnancy history.  Screening You may have the following tests or measurements:  Height, weight, and BMI.  Blood pressure.  Lipid and cholesterol levels. These may be checked every 5 years, or more frequently if you are over 81 years old.  Skin check.  Lung cancer screening. You may have this screening every year starting at age 78 if you have a 30-pack-year history of smoking and currently smoke or have quit within the past 15 years.  Fecal occult blood test (FOBT) of the stool. You may have this test every year starting at age 65.  Flexible sigmoidoscopy or colonoscopy. You may have a sigmoidoscopy every 5 years or a colonoscopy  every 10 years starting at age 30.  Hepatitis C blood test.  Hepatitis B blood test.  Sexually transmitted disease (STD) testing.  Diabetes screening. This is done by checking your blood sugar (glucose) after you have not eaten for a while (fasting). You may have this done every 1-3 years.  Mammogram. This may be done every 1-2 years. Talk to your health care provider about when you should start having regular mammograms. This may depend on whether you have a family history of breast cancer.  BRCA-related cancer screening. This may be done if you have a family history of breast, ovarian, tubal, or peritoneal cancers.  Pelvic exam and Pap test. This may be done every 3 years starting at age 80. Starting at age 36, this may be done every 5 years if you have a Pap test in combination with an HPV test.  Bone density scan. This is done to screen for osteoporosis. You may have this scan if you are at high risk for osteoporosis.  Discuss your test results, treatment options, and if necessary, the need for more tests with your health care provider. Vaccines Your health care provider may recommend certain vaccines, such as:  Influenza vaccine. This is recommended every year.  Tetanus, diphtheria, and acellular pertussis (Tdap, Td) vaccine. You may need a Td booster every 10 years.  Varicella vaccine. You may need this if you have not been vaccinated.  Zoster vaccine. You may need this after age 5.  Measles, mumps, and rubella (MMR) vaccine. You may need at least one dose of MMR if you were born in  1956 or later. You may also need a second dose.  Pneumococcal 13-valent conjugate (PCV13) vaccine. You may need this if you have certain conditions and were not previously vaccinated.  Pneumococcal polysaccharide (PPSV23) vaccine. You may need one or two doses if you smoke cigarettes or if you have certain conditions.  Meningococcal vaccine. You may need this if you have certain  conditions.  Hepatitis A vaccine. You may need this if you have certain conditions or if you travel or work in places where you may be exposed to hepatitis A.  Hepatitis B vaccine. You may need this if you have certain conditions or if you travel or work in places where you may be exposed to hepatitis B.  Haemophilus influenzae type b (Hib) vaccine. You may need this if you have certain conditions.  Talk to your health care provider about which screenings and vaccines you need and how often you need them. This information is not intended to replace advice given to you by your health care provider. Make sure you discuss any questions you have with your health care provider. Document Released: 08/10/2015 Document Revised: 04/02/2016 Document Reviewed: 05/15/2015 Elsevier Interactive Patient Education  2017 [employer].

## 2017-02-12 NOTE — Progress Notes (Signed)
Subjective:     Tonya Banks is a 56 y.o. female and is here for a comprehensive physical exam. The patient reports no problems. She was recently diagnosed with gonorrhea which she was exposed to by her husband.   Social History   Social History  . Marital status: Married    Spouse name: N/A  . Number of children: 3  . Years of education: N/A   Occupational History  . Not on file.   Social History Main Topics  . Smoking status: Never Smoker  . Smokeless tobacco: Never Used  . Alcohol use 0.0 - 0.5 oz/week     Comment: month  . Drug use: No  . Sexual activity: Yes    Partners: Male   Other Topics Concern  . Not on file   Social History Narrative   No regular exercise.     Health Maintenance  Topic Date Due  . PAP SMEAR  08/22/2016  . INFLUENZA VACCINE  02/25/2017  . TETANUS/TDAP  03/22/2017  . COLONOSCOPY  09/07/2017  . MAMMOGRAM  01/22/2019  . Hepatitis C Screening  Completed  . HIV Screening  Completed    The following portions of the patient's history were reviewed and updated as appropriate: allergies, current medications, past family history, past medical history, past social history, past surgical history and problem list.  Review of Systems A comprehensive review of systems was negative.   Objective:    BP 130/71   Pulse 62   Ht 5\' 6"  (1.676 m)   Wt 180 lb (81.6 kg)   LMP 11/11/2013   BMI 29.05 kg/m  General appearance: alert, cooperative and appears stated age Head: Normocephalic, without obvious abnormality, atraumatic Eyes: conj clear, EOMI, PEERLA Ears: normal TM's and external ear canals both ears Nose: Nares normal. Septum midline. Mucosa normal. No drainage or sinus tenderness. Throat: lips, mucosa, and tongue normal; teeth and gums normal Neck: no adenopathy, no carotid bruit, no JVD, supple, symmetrical, trachea midline and thyroid not enlarged, symmetric, no tenderness/mass/nodules Back: symmetric, no curvature. ROM normal. No CVA  tenderness. Lungs: clear to auscultation bilaterally Breasts: normal appearance, no masses or tenderness Heart: regular rate and rhythm, S1, S2 normal, no murmur, click, rub or gallop Abdomen: soft, non-tender; bowel sounds normal; no masses,  no organomegaly Pelvic: cervix normal in appearance, external genitalia normal, no adnexal masses or tenderness, no cervical motion tenderness, rectovaginal septum normal, uterus normal size, shape, and consistency, vagina normal without discharge and polyp removed at cervix Extremities: extremities normal, atraumatic, no cyanosis or edema Pulses: 2+ and symmetric Skin: Skin color, texture, turgor normal. No rashes or lesions Lymph nodes: Cervical, supraclavicular, and axillary nodes normal. Neurologic: Alert and oriented X 3, normal strength and tone. Normal symmetric reflexes. Normal coordination and gait    Assessment:    Healthy female exam.     Plan:     See After Visit Summary for Counseling Recommendations   Keep up a regular exercise program and make sure you are eating a healthy diet Try to eat 4 servings of dairy a day, or if you are lactose intolerant take a calcium with vitamin D daily.  Your vaccines are up to date.  Pap smear performed. HPV testing added.  Cervical polyp - sent for path.  Gonorrhea - completed tx. REtest in 3 months.

## 2017-02-13 LAB — CYTOLOGY - PAP
Diagnosis: NEGATIVE
HPV (WINDOPATH): NOT DETECTED

## 2017-03-12 ENCOUNTER — Other Ambulatory Visit: Payer: Self-pay

## 2017-03-12 MED ORDER — GABAPENTIN 100 MG PO CAPS: 100.0000 mg | ORAL_CAPSULE | Freq: Every day | ORAL | 3 refills | Status: DC

## 2017-03-12 NOTE — Telephone Encounter (Signed)
Patient request refill for Gabapentin. Tonya Banks,CMA

## 2017-05-23 LAB — C. TRACHOMATIS/N. GONORRHOEAE RNA
C. TRACHOMATIS RNA, TMA: NOT DETECTED
N. GONORRHOEAE RNA, TMA: NOT DETECTED

## 2017-09-23 ENCOUNTER — Other Ambulatory Visit: Payer: Self-pay | Admitting: Family Medicine

## 2017-10-01 ENCOUNTER — Telehealth: Payer: Self-pay | Admitting: Family Medicine

## 2017-10-01 MED ORDER — DICLOFENAC SODIUM 1.6 % TD GEL: 1.0000 "application " | Freq: Four times a day (QID) | TRANSDERMAL | 2 refills | Status: DC | PRN

## 2017-10-01 NOTE — Telephone Encounter (Signed)
Call pt: Diclofenac 1%  gel not covered on the insurance plan.  New rx sent fot 1.6%

## 2017-10-02 ENCOUNTER — Other Ambulatory Visit: Payer: Self-pay | Admitting: Family Medicine

## 2017-10-02 MED ORDER — DICLOFENAC SODIUM 3 % TD GEL: 1.0000 | Freq: Two times a day (BID) | TRANSDERMAL | 2 refills | Status: DC | PRN

## 2017-10-02 NOTE — Telephone Encounter (Signed)
Pt advised..Tonya Banks, CMA  

## 2017-10-07 ENCOUNTER — Ambulatory Visit: Payer: No Typology Code available for payment source | Admitting: Family Medicine

## 2017-10-07 ENCOUNTER — Encounter: Payer: Self-pay | Admitting: Family Medicine

## 2017-10-07 VITALS — BP 131/59 | HR 64 | Ht 66.0 in

## 2017-10-07 DIAGNOSIS — J029 Acute pharyngitis, unspecified: Secondary | ICD-10-CM

## 2017-10-07 DIAGNOSIS — R300 Dysuria: Secondary | ICD-10-CM

## 2017-10-07 DIAGNOSIS — N760 Acute vaginitis: Secondary | ICD-10-CM | POA: Diagnosis not present

## 2017-10-07 DIAGNOSIS — Z23 Encounter for immunization: Secondary | ICD-10-CM

## 2017-10-07 LAB — POCT URINALYSIS DIPSTICK
Bilirubin, UA: NEGATIVE
Blood, UA: NEGATIVE
Glucose, UA: NEGATIVE
Ketones, UA: NEGATIVE
Leukocytes, UA: NEGATIVE
NITRITE UA: NEGATIVE
PROTEIN UA: NEGATIVE
Spec Grav, UA: 1.025 (ref 1.010–1.025)
Urobilinogen, UA: 0.2 E.U./dL
pH, UA: 5 (ref 5.0–8.0)

## 2017-10-07 NOTE — Progress Notes (Signed)
Subjective:    Patient ID: Tonya Banks, female    DOB: 1961/07/16, 57 y.o.   MRN: [id number]  HPI 57 yo female comes on complaining of dysuria for about a week and some abdominal tenderness.  Son noticed that occasionally when she sits down she does get a little bit of discomfort.  No fever, sweats or chills.  She has also had some vaginal discharge and swelling as well. She also feels dry vaginally and has been using lotion.    She also had  a sore throat particularly on the left side a couple weeks ago and saw a white spot there.  She did the tele-doc service through her work and was prescribed amoxicillin.  She felt much better after taking the antibiotic but did want me to just check her throat today.   Review of Systems  BP (!) 131/59   Pulse 64   Ht 5\' 6"  (1.676 m)   LMP 11/11/2013   SpO2 100%   BMI 29.05 kg/m     Allergies  Allergen Reactions  . Dexilant [Dexlansoprazole]     Sharp pains on stomach  . Pravastatin Other (See Comments)    msucle aches   . Sulfonamide Derivatives Other (See Comments)    Stomach pain.     No past medical history on file.  Past Surgical History:  Procedure Laterality Date  . APPENDECTOMY    . BREAST BIOPSY Left    stereo 2017  . breast lift  2011  . BREAST SURGERY    . REDUCTION MAMMAPLASTY Bilateral 1992  . TUBAL LIGATION    . tummy tuck    . uterine ablation  2000    Social History   Socioeconomic History  . Marital status: Married    Spouse name: Not on file  . Number of children: 3  . Years of education: Not on file  . Highest education level: Not on file  Social Needs  . Financial resource strain: Not on file  . Food insecurity - worry: Not on file  . Food insecurity - inability: Not on file  . Transportation needs - medical: Not on file  . Transportation needs - non-medical: Not on file  Occupational History  . Not on file  Tobacco Use  . Smoking status: Never Smoker  . Smokeless tobacco: Never Used   Substance and Sexual Activity  . Alcohol use: Yes    Alcohol/week: 0.0 - 0.5 oz    Comment: month  . Drug use: No  . Sexual activity: Yes    Partners: Male  Other Topics Concern  . Not on file  Social History Narrative   No regular exercise.      Family History  Problem Relation Age of Onset  . Cancer Father        mesothelioma  . Gout Father   . Hypertension Mother     Outpatient Encounter Medications as of 10/07/2017  Medication Sig  . calcium carbonate (OS-CAL) 600 MG TABS Take 600 mg by mouth 2 (two) times daily with a meal.    . Cholecalciferol (VITAMIN D PO) Take by mouth.  . Clobetasol Prop Emollient Base (CLOBETASOL PROPIONATE E) 0.05 % emollient cream Apply 1 application topically 2 (two) times daily.  . Coenzyme Q10 (CO Q 10 PO) Take 1 tablet by mouth daily.  . Diclofenac Sodium 3 % GEL Place 1 application onto the skin 2 (two) times daily as needed.  . fish oil-omega-3 fatty acids 1000 MG capsule  Take 4 g by mouth daily.    Marland Kitchen gabapentin (NEURONTIN) 100 MG capsule Take 1-3 capsules (100-300 mg total) by mouth at bedtime.  . lansoprazole (PREVACID) 30 MG capsule Take 1 capsule (30 mg total) by mouth 2 (two) times daily before a meal.  . LIVALO 2 MG TABS TAKE 1 TABLET (2 MG TOTAL) BY MOUTH AT BEDTIME.  Marland Kitchen POTASSIUM PO Take by mouth.  . vitamin C (ASCORBIC ACID) 500 MG tablet Take 500 mg by mouth daily.  . [DISCONTINUED] bacitracin-neomycin-polymyxin-hydrocortisone (CORTISPORIN) 1 % ointment Apply 1 application topically 2 (two) times daily.  . [DISCONTINUED] cyclobenzaprine (FLEXERIL) 10 MG tablet TAKE 1 TABLET 3 TIMES A DAY AS NEEDED SPASMS (Patient not taking: Reported on 02/12/2017)  . [DISCONTINUED] Diclofenac Sodium 1.6 % GEL Place 1 application onto the skin 4 (four) times daily as needed.   No facility-administered encounter medications on file as of 10/07/2017.          Objective:   Physical Exam  Constitutional: She is oriented to person, place, and time.  She appears well-developed and well-nourished.  HENT:  Head: Normocephalic and atraumatic.  Nose: Nose normal.  Mouth/Throat: Oropharynx is clear and moist.  Eyes: Conjunctivae and EOM are normal.  Cardiovascular: Normal rate, regular rhythm and normal heart sounds.  Pulmonary/Chest: Effort normal and breath sounds normal.  Abdominal: Soft. Bowel sounds are normal.  Mild right lower and left lower and suprapubic tenderness.  Musculoskeletal:  No CVA tenderness.    Neurological: She is alert and oriented to person, place, and time.  Skin: Skin is warm and dry. No pallor.  Psychiatric: She has a normal mood and affect. Her behavior is normal.  Vitals reviewed.         Assessment & Plan:  Urinary frequency-urinalysis was negative but will send for culture for confirmation.  She has been trying to drink plenty of water.  Vaginitis-we will do a wet prep.  If negative then consider further work-up for STDs as she did have exposure to gonorrhea via her husband back in the fall. Wants to hold off on STD testing for now as it was very expensive and insurance didn't cover the cost well back in Oct.   Recent sore throat-throat exam is normal.  Gave reassurance.  Tdap updated today.

## 2017-10-08 LAB — URINE CULTURE
MICRO NUMBER: 90320269
SPECIMEN QUALITY: ADEQUATE

## 2017-10-08 LAB — WET PREP FOR TRICH, YEAST, CLUE
MICRO NUMBER: 90320242
Specimen Quality: ADEQUATE

## 2017-10-12 ENCOUNTER — Ambulatory Visit: Payer: No Typology Code available for payment source

## 2017-10-19 ENCOUNTER — Ambulatory Visit: Payer: No Typology Code available for payment source | Admitting: Obstetrics & Gynecology

## 2017-10-19 ENCOUNTER — Encounter: Payer: Self-pay | Admitting: Obstetrics & Gynecology

## 2017-10-19 VITALS — BP 128/75 | HR 60 | Resp 16 | Ht 66.0 in | Wt 189.0 lb

## 2017-10-19 DIAGNOSIS — Z113 Encounter for screening for infections with a predominantly sexual mode of transmission: Secondary | ICD-10-CM

## 2017-10-19 DIAGNOSIS — R102 Pelvic and perineal pain: Secondary | ICD-10-CM | POA: Diagnosis not present

## 2017-10-19 MED ORDER — VALACYCLOVIR HCL 1 G PO TABS: ORAL_TABLET | ORAL | 12 refills | Status: DC

## 2017-10-19 NOTE — Progress Notes (Signed)
Patient ID: Tonya Banks, female   DOB: Dec 10, 1960, 57 y.o.   MRN: [id number]  Chief Complaint  Patient presents with  . Pelvic Pain    HPI Tonya Banks is a 57 y.o. female.  HPI 57 yo married P3 (36, 109, and 3 yo kids, 1 grand son) here today with the issue of vulvar pain, "burning" even with water, felt swollen. She also had suprapubic pain with palpation. This was about 2 weeks ago. Everything is mostly back to normal, but she feels a "heaviness" in her vagina. She took a round of monistat and over the counter "cystex" prior to the improvement. She has a h/o + GC from hubby 7/18. She had negative tests 10/18. She has a h/o HSV.  History reviewed. No pertinent past medical history.  Past Surgical History:  Procedure Laterality Date  . APPENDECTOMY    . BREAST BIOPSY Left    stereo 2017  . breast lift  2011  . BREAST SURGERY    . REDUCTION MAMMAPLASTY Bilateral 1992  . TUBAL LIGATION    . tummy tuck    . uterine ablation  2000    Family History  Problem Relation Age of Onset  . Cancer Father        mesothelioma  . Gout Father   . Hypertension Mother     Social History Social History   Tobacco Use  . Smoking status: Never Smoker  . Smokeless tobacco: Never Used  Substance Use Topics  . Alcohol use: Yes    Alcohol/week: 0.0 - 0.5 oz    Comment: month  . Drug use: No    Allergies  Allergen Reactions  . Dexilant [Dexlansoprazole]     Sharp pains on stomach  . Pravastatin Other (See Comments)    msucle aches   . Sulfonamide Derivatives Other (See Comments)    Stomach pain.     Current Outpatient Medications  Medication Sig Dispense Refill  . calcium carbonate (OS-CAL) 600 MG TABS Take 600 mg by mouth 2 (two) times daily with a meal.      . Cholecalciferol (VITAMIN D PO) Take by mouth.    . Clobetasol Prop Emollient Base (CLOBETASOL PROPIONATE E) 0.05 % emollient cream Apply 1 application topically 2 (two) times daily. 30 g 0  . Coenzyme Q10 (CO Q 10  PO) Take 1 tablet by mouth daily.    . Diclofenac Sodium 3 % GEL Place 1 application onto the skin 2 (two) times daily as needed. 400 g 2  . fish oil-omega-3 fatty acids 1000 MG capsule Take 4 g by mouth daily.      Marland Kitchen gabapentin (NEURONTIN) 100 MG capsule Take 1-3 capsules (100-300 mg total) by mouth at bedtime. 270 capsule 3  . lansoprazole (PREVACID) 30 MG capsule Take 1 capsule (30 mg total) by mouth 2 (two) times daily before a meal. 90 capsule 2  . LIVALO 2 MG TABS TAKE 1 TABLET (2 MG TOTAL) BY MOUTH AT BEDTIME. 30 tablet 5  . POTASSIUM PO Take by mouth.    . vitamin C (ASCORBIC ACID) 500 MG tablet Take 500 mg by mouth daily.     No current facility-administered medications for this visit.     Review of Systems Review of Systems  Blood pressure 128/75, pulse 60, resp. rate 16, height 5\' 6"  (1.676 m), weight 189 lb (85.7 kg), last menstrual period 11/11/2013.  Physical Exam Physical Exam Breathing, conversing, and ambulating normally Well nourished, well hydrated White female, no  apparent distress Normal appearing vulva and vagina Grade 1 uterine prolapse, no cystocele or rectocele Data Reviewed  Satisfactory for evaluation, endocervical/transformation zone component PRESENT. Diagnosis NEGATIVE FOR INTRAEPITHELIAL LESIONS OR MALIGNANCY.  Assessment    Vulvar symptoms 2 weeks ago- sound very similar to HSV effects Vaginal pressure   Plan    Valtrex prn Reassurance given regarding her minimal prolapse STI cervical testing       Jonasia Coiner C Debby Clyne 10/19/2017, 3:09 PM

## 2017-10-21 LAB — CERVICOVAGINAL ANCILLARY ONLY
Chlamydia: NEGATIVE
NEISSERIA GONORRHEA: NEGATIVE

## 2017-12-08 ENCOUNTER — Ambulatory Visit: Payer: No Typology Code available for payment source | Admitting: Physician Assistant

## 2017-12-08 ENCOUNTER — Encounter: Payer: Self-pay | Admitting: Physician Assistant

## 2017-12-08 VITALS — BP 148/72 | HR 62 | Ht 66.0 in | Wt 185.0 lb

## 2017-12-08 DIAGNOSIS — N644 Mastodynia: Secondary | ICD-10-CM

## 2017-12-08 DIAGNOSIS — N6310 Unspecified lump in the right breast, unspecified quadrant: Secondary | ICD-10-CM | POA: Insufficient documentation

## 2017-12-08 NOTE — Progress Notes (Addendum)
   Subjective:    Patient ID: Tonya Banks, female    DOB: [birth year], 57 y.o.   MRN: [id number]  HPI Pt is a 57 yo female who presents to the clinic with right sided breast pain and possible mass. She has noticed this pain for the last 2-3 weeks. Started with sharp pain over nipple and sensitive to the shower water hitting her nipple. Denies any discharge. Now she feels a fullness in the right lower outer breast. She does self exams and feels a lot of "lumps" but doesn't know if she should be alarmed. No fever or chills. No family hx of BC. She has had breast reduction 57 yo ago and breast lift 10 years ago.  Last mammogram in 12/2016 normal.  .. Family History  Problem Relation Age of Onset  . Cancer Father        mesothelioma  . Gout Father   . Hypertension Mother       Review of Systems  All other systems reviewed and are negative.      Objective:   Physical Exam  Constitutional: She is oriented to person, place, and time. She appears well-developed and well-nourished.  HENT:  Head: Normocephalic and atraumatic.  Neck:  No lymph node enlargement of right axilla.   Cardiovascular: Normal rate and regular rhythm.  Pulmonary/Chest:    Lymphadenopathy:    She has no cervical adenopathy.  Neurological: She is alert and oriented to person, place, and time.  Psychiatric: She has a normal mood and affect. Her behavior is normal.          Assessment & Plan:  Marland KitchenMarland KitchenDiagnoses and all orders for this visit:  Breast pain, right -     Cancel: MM Digital Diagnostic Unilat R -     Cancel: US BREAST COMPLETE UNI RIGHT INC AXILLA -     MM DIAG BREAST TOMO UNI RIGHT -     US BREAST LTD UNI RIGHT INC AXILLA  Breast mass, right -     Cancel: MM Digital Diagnostic Unilat R -     Cancel: US BREAST COMPLETE UNI RIGHT INC AXILLA -     MM DIAG BREAST TOMO UNI RIGHT -     US BREAST LTD UNI RIGHT INC AXILLA   I did palpate a mass. Need diagnostic mammogram. Pt is not consuming a lot  of caffiene. Consider ibuprofen for pain. Cool compresses on breast.

## 2017-12-08 NOTE — Progress Notes (Signed)
Last 01/21/17  Ache pain. Burning/aching.  Reduction 25 years.  Lift 10 years.   Improving a little over last weak.

## 2017-12-11 ENCOUNTER — Ambulatory Visit
Admission: RE | Admit: 2017-12-11 | Discharge: 2017-12-11 | Disposition: A | Discharge status: Home / Self Care | Payer: No Typology Code available for payment source | Source: Ambulatory Visit | Attending: Physician Assistant | Admitting: Physician Assistant

## 2017-12-11 NOTE — Progress Notes (Signed)
Great news. Normal mammogram.

## 2018-01-11 ENCOUNTER — Other Ambulatory Visit: Payer: Self-pay | Admitting: Family Medicine

## 2018-01-11 DIAGNOSIS — Z1239 Encounter for other screening for malignant neoplasm of breast: Secondary | ICD-10-CM

## 2018-01-27 ENCOUNTER — Ambulatory Visit (INDEPENDENT_AMBULATORY_CARE_PROVIDER_SITE_OTHER): Payer: No Typology Code available for payment source

## 2018-01-27 ENCOUNTER — Other Ambulatory Visit: Payer: Self-pay | Admitting: Family Medicine

## 2018-01-27 DIAGNOSIS — Z1231 Encounter for screening mammogram for malignant neoplasm of breast: Secondary | ICD-10-CM | POA: Diagnosis not present

## 2018-01-27 DIAGNOSIS — Z1239 Encounter for other screening for malignant neoplasm of breast: Secondary | ICD-10-CM

## 2018-02-09 LAB — HM COLONOSCOPY

## 2018-03-04 ENCOUNTER — Telehealth: Payer: Self-pay | Admitting: Family Medicine

## 2018-03-04 ENCOUNTER — Ambulatory Visit (INDEPENDENT_AMBULATORY_CARE_PROVIDER_SITE_OTHER): Payer: No Typology Code available for payment source | Admitting: Family Medicine

## 2018-03-04 ENCOUNTER — Encounter: Payer: Self-pay | Admitting: Family Medicine

## 2018-03-04 VITALS — BP 115/50 | HR 50 | Ht 66.0 in | Wt 185.0 lb

## 2018-03-04 DIAGNOSIS — Z0000 Encounter for general adult medical examination without abnormal findings: Secondary | ICD-10-CM | POA: Diagnosis not present

## 2018-03-04 LAB — COMPLETE METABOLIC PANEL WITH GFR
AG RATIO: 1.8 (calc) (ref 1.0–2.5)
ALT: 16 U/L (ref 6–29)
AST: 18 U/L (ref 10–35)
Albumin: 4.4 g/dL (ref 3.6–5.1)
Alkaline phosphatase (APISO): 79 U/L (ref 33–130)
BUN: 10 mg/dL (ref 7–25)
CALCIUM: 9.5 mg/dL (ref 8.6–10.4)
CO2: 30 mmol/L (ref 20–32)
CREATININE: 0.63 mg/dL (ref 0.50–1.05)
Chloride: 107 mmol/L (ref 98–110)
GFR, EST AFRICAN AMERICAN: 117 mL/min/{1.73_m2} (ref >=60)
GFR, EST NON AFRICAN AMERICAN: 101 mL/min/{1.73_m2} (ref >=60)
Globulin: 2.5 g/dL (calc) (ref 1.9–3.7)
Glucose, Bld: 103 mg/dL — ABNORMAL HIGH (ref 65–99)
POTASSIUM: 4.3 mmol/L (ref 3.5–5.3)
Sodium: 142 mmol/L (ref 135–146)
TOTAL PROTEIN: 6.9 g/dL (ref 6.1–8.1)
Total Bilirubin: 0.6 mg/dL (ref 0.2–1.2)

## 2018-03-04 LAB — CBC
HCT: 38.6 % (ref 35.0–45.0)
HEMOGLOBIN: 13 g/dL (ref 11.7–15.5)
MCH: 30.4 pg (ref 27.0–33.0)
MCHC: 33.7 g/dL (ref 32.0–36.0)
MCV: 90.2 fL (ref 80.0–100.0)
MPV: 10.7 fL (ref 7.5–12.5)
Platelets: 218 10*3/uL (ref 140–400)
RBC: 4.28 10*6/uL (ref 3.80–5.10)
RDW: 12.4 % (ref 11.0–15.0)
WBC: 5 10*3/uL (ref 3.8–10.8)

## 2018-03-04 LAB — LIPID PANEL
CHOL/HDL RATIO: 2.8 (calc) (ref <5.0)
Cholesterol: 144 mg/dL (ref <200)
HDL: 51 mg/dL (ref >50)
LDL CHOLESTEROL (CALC): 74 mg/dL
NON-HDL CHOLESTEROL (CALC): 93 mg/dL (ref <130)
TRIGLYCERIDES: 105 mg/dL (ref <150)

## 2018-03-04 MED ORDER — PITAVASTATIN CALCIUM 2 MG PO TABS: 1.0000 | ORAL_TABLET | Freq: Every day | ORAL | 3 refills | Status: DC

## 2018-03-04 NOTE — Progress Notes (Signed)
Subjective:     Tonya Banks is a 57 y.o. female and is here for a comprehensive physical exam. The patient reports no problems.  Social History   Socioeconomic History  . Marital status: Married    Spouse name: Not on file  . Number of children: 3  . Years of education: Not on file  . Highest education level: Not on file  Occupational History  . Not on file  Social Needs  . Financial resource strain: Not on file  . Food insecurity:    Worry: Not on file    Inability: Not on file  . Transportation needs:    Medical: Not on file    Non-medical: Not on file  Tobacco Use  . Smoking status: Never Smoker  . Smokeless tobacco: Never Used  Substance and Sexual Activity  . Alcohol use: Yes    Alcohol/week: 0.0 - 1.0 standard drinks    Comment: month  . Drug use: No  . Sexual activity: Yes    Partners: Male    Birth control/protection: Post-menopausal  Lifestyle  . Physical activity:    Days per week: Not on file    Minutes per session: Not on file  . Stress: Not on file  Relationships  . Social connections:    Talks on phone: Not on file    Gets together: Not on file    Attends religious service: Not on file    Active member of club or organization: Not on file    Attends meetings of clubs or organizations: Not on file    Relationship status: Not on file  . Intimate partner violence:    Fear of current or ex partner: Not on file    Emotionally abused: Not on file    Physically abused: Not on file    Forced sexual activity: Not on file  Other Topics Concern  . Not on file  Social History Narrative   No regular exercise.     Health Maintenance  Topic Date Due  . COLONOSCOPY  09/07/2017  . INFLUENZA VACCINE  02/25/2018  . MAMMOGRAM  01/28/2020  . PAP SMEAR  02/12/2022  . TETANUS/TDAP  10/08/2027  . Hepatitis C Screening  Completed  . HIV Screening  Completed    The following portions of the patient's history were reviewed and updated as appropriate:  allergies, current medications, past family history, past medical history, past social history, past surgical history and problem list.  Review of Systems A comprehensive review of systems was negative.   Objective:    BP (!) 115/50   Pulse (!) 50   Ht 5\' 6"  (1.676 m)   Wt 185 lb (83.9 kg)   LMP 11/11/2013   SpO2 100%   BMI 29.86 kg/m  General appearance: alert, cooperative and appears stated age Head: Normocephalic, without obvious abnormality, atraumatic Eyes: conj clear, EOMI, PEERLA Ears: normal TM's and external ear canals both ears Nose: Nares normal. Septum midline. Mucosa normal. No drainage or sinus tenderness. Throat: lips, mucosa, and tongue normal; teeth and gums normal Neck: no adenopathy, no carotid bruit, no JVD, supple, symmetrical, trachea midline and thyroid not enlarged, symmetric, no tenderness/mass/nodules Back: symmetric, no curvature. ROM normal. No CVA tenderness. Lungs: clear to auscultation bilaterally Breasts: Normal appearance, did an exam on the right breast which she was having some issues with a couple of months ago.  No lesions no masses no significant tenderness today. Heart: regular rate and rhythm, S1, S2 normal, no murmur, click,  rub or gallop Abdomen: soft, non-tender; bowel sounds normal; no masses,  no organomegaly Extremities: extremities normal, atraumatic, no cyanosis or edema Pulses: 2+ and symmetric Skin: Skin color, texture, turgor normal. No rashes or lesions Lymph nodes: Cervical adenopathy: nl and Supraclavicular adenopathy: nl Neurologic: Alert and oriented X 3, normal strength and tone. Normal symmetric reflexes. Normal coordination and gait    Assessment:    Healthy female exam.      Plan:     See After Visit Summary for Counseling Recommendations   Keep up a regular exercise program and make sure you are eating a healthy diet Try to eat 4 servings of dairy a day, or if you are lactose intolerant take a calcium with vitamin  D daily.  Your vaccines are up to date.  Plans to get her flu vaccine at work this year.

## 2018-03-04 NOTE — Telephone Encounter (Signed)
Added to Shingrix list.

## 2018-03-04 NOTE — Patient Instructions (Signed)

## 2018-03-06 ENCOUNTER — Other Ambulatory Visit: Payer: Self-pay | Admitting: Family Medicine

## 2018-03-16 ENCOUNTER — Ambulatory Visit (INDEPENDENT_AMBULATORY_CARE_PROVIDER_SITE_OTHER): Payer: No Typology Code available for payment source | Admitting: Family Medicine

## 2018-03-16 VITALS — BP 135/83 | HR 56

## 2018-03-16 DIAGNOSIS — Z23 Encounter for immunization: Secondary | ICD-10-CM | POA: Diagnosis not present

## 2018-03-16 NOTE — Progress Notes (Signed)
Pt came into clinic today for first Shingrix vaccine. Pt answered all preliminary questions appropriately. Pt tolerated injection in right deltoid well. No immediate complications. Pt advised to return to clinic in two months for 2nd injection.

## 2018-03-16 NOTE — Progress Notes (Signed)
Agree with documentation as above.   Alianna Wurster, MD  

## 2018-04-01 ENCOUNTER — Encounter: Payer: Self-pay | Admitting: Family Medicine

## 2018-05-14 ENCOUNTER — Ambulatory Visit (INDEPENDENT_AMBULATORY_CARE_PROVIDER_SITE_OTHER): Payer: No Typology Code available for payment source | Admitting: Family Medicine

## 2018-05-14 VITALS — Temp 98.0°F | Ht 66.0 in | Wt 185.0 lb

## 2018-05-14 DIAGNOSIS — Z23 Encounter for immunization: Secondary | ICD-10-CM

## 2018-05-14 NOTE — Progress Notes (Signed)
Patient presents to clinic today for her 2nd Shingrix vaccine. Last Shingirix was administered on 03/16/2018. I spoke with provider and provider verified it was okay to give 2nd Shingirx vaccine. Patient denies any recent fever or illnesses. Patient temperature today was 98.0 (oral). Shringix vaccine given in right deltoid without complications. No further questions or concerns at this time. Casilda Carls, CMA.

## 2018-05-14 NOTE — Progress Notes (Signed)
Beatrice Lecher, MD

## 2018-12-17 ENCOUNTER — Encounter: Payer: Self-pay | Admitting: Family Medicine

## 2018-12-17 ENCOUNTER — Other Ambulatory Visit: Payer: Self-pay

## 2018-12-17 ENCOUNTER — Ambulatory Visit (INDEPENDENT_AMBULATORY_CARE_PROVIDER_SITE_OTHER): Payer: No Typology Code available for payment source

## 2018-12-17 ENCOUNTER — Ambulatory Visit: Payer: No Typology Code available for payment source | Admitting: Family Medicine

## 2018-12-17 VITALS — BP 127/67 | HR 52 | Ht 66.0 in | Wt 183.0 lb

## 2018-12-17 DIAGNOSIS — R6884 Jaw pain: Secondary | ICD-10-CM

## 2018-12-17 DIAGNOSIS — K219 Gastro-esophageal reflux disease without esophagitis: Secondary | ICD-10-CM

## 2018-12-17 DIAGNOSIS — F430 Acute stress reaction: Secondary | ICD-10-CM | POA: Diagnosis not present

## 2018-12-17 DIAGNOSIS — R0789 Other chest pain: Secondary | ICD-10-CM

## 2018-12-17 MED ORDER — LANSOPRAZOLE 30 MG PO CPDR: 30.0000 mg | DELAYED_RELEASE_CAPSULE | Freq: Two times a day (BID) | ORAL | 1 refills | Status: DC

## 2018-12-17 MED ORDER — SUCRALFATE 1 GM/10ML PO SUSP: 1.0000 g | Freq: Three times a day (TID) | ORAL | 1 refills | Status: DC

## 2018-12-17 NOTE — Progress Notes (Signed)
Established Patient Office Visit  Subjective:  Patient ID: Tonya Banks, female    DOB: 1960/11/25  Age: 58 y.o. MRN: [id number]  CC:  Chief Complaint  Patient presents with  . atypical chest pain    HPI KINZEY Banks presents chest pain.  She says a couple times she has been experiencing some epigastric/lower chest pain.  She says she thinks that sometimes it may just be her reflux.  But about 2 nights ago it was really bothering her and very sore and uncomfortable.  She said she finally got up and took a couple of old ranitidine which she sometimes will take on top of her Prevacid that she says she has been taking pretty regularly.  But then last night she woke up with pain radiating up into her jaw and into her right shoulder.  She says it took her quite some time to be able to relax to fall back asleep.  She reports that she also just has not been sleeping well in general.  She has been under a lot of stress.  Says even today she still has some soreness in her chest but the pain on the right side of her neck and shoulder is actually better.  She says she has more epigastric pain when she eats.  She denies any nausea or vomiting.  No change in bowel movements.  She is stressed bc she is taking over her sisters position at work while her sister is out for knee surgery, and her father-in-law has health problems.  Been in and out of the hospital.  She has another family member who is also currently on hospice and is not doing well.  Also just very unhappy at her current job.  She said she was in fact working on her resume to start looking for a new job right around the time that Abie happened.  No past medical history on file.  Past Surgical History:  Procedure Laterality Date  . APPENDECTOMY    . BREAST BIOPSY Left    stereo 2017  . breast lift  2011  . BREAST SURGERY    . REDUCTION MAMMAPLASTY Bilateral 1992  . TUBAL LIGATION    . tummy tuck    . uterine ablation  2000     Family History  Problem Relation Age of Onset  . Cancer Father        mesothelioma  . Gout Father   . Hypertension Mother     Social History   Socioeconomic History  . Marital status: Married    Spouse name: Not on file  . Number of children: 3  . Years of education: Not on file  . Highest education level: Not on file  Occupational History  . Not on file  Social Needs  . Financial resource strain: Not on file  . Food insecurity:    Worry: Not on file    Inability: Not on file  . Transportation needs:    Medical: Not on file    Non-medical: Not on file  Tobacco Use  . Smoking status: Never Smoker  . Smokeless tobacco: Never Used  Substance and Sexual Activity  . Alcohol use: Yes    Alcohol/week: 0.0 - 1.0 standard drinks    Comment: month  . Drug use: No  . Sexual activity: Yes    Partners: Male    Birth control/protection: Post-menopausal  Lifestyle  . Physical activity:    Days per week: Not on file  Minutes per session: Not on file  . Stress: Not on file  Relationships  . Social connections:    Talks on phone: Not on file    Gets together: Not on file    Attends religious service: Not on file    Active member of club or organization: Not on file    Attends meetings of clubs or organizations: Not on file    Relationship status: Not on file  . Intimate partner violence:    Fear of current or ex partner: Not on file    Emotionally abused: Not on file    Physically abused: Not on file    Forced sexual activity: Not on file  Other Topics Concern  . Not on file  Social History Narrative   No regular exercise.      Outpatient Medications Prior to Visit  Medication Sig Dispense Refill  . calcium carbonate (OS-CAL) 600 MG TABS Take 600 mg by mouth 2 (two) times daily with a meal.      . Cholecalciferol (VITAMIN D PO) Take by mouth.    . Clobetasol Prop Emollient Base (CLOBETASOL PROPIONATE E) 0.05 % emollient cream Apply 1 application topically 2  (two) times daily. 30 g 0  . Coenzyme Q10 (CO Q 10 PO) Take 1 tablet by mouth daily.    . Diclofenac Sodium 3 % GEL Place 1 application onto the skin 2 (two) times daily as needed. 400 g 2  . fish oil-omega-3 fatty acids 1000 MG capsule Take 4 g by mouth daily.      Marland Kitchen gabapentin (NEURONTIN) 100 MG capsule TAKE 1-3 CAPSULES (100-300 MG TOTAL) BY MOUTH AT BEDTIME. 270 capsule 3  . Pitavastatin Calcium (LIVALO) 2 MG TABS Take 1 tablet (2 mg total) by mouth at bedtime. 90 tablet 3  . POTASSIUM PO Take by mouth.    . Turmeric 500 MG TABS Take 1,000 mg by mouth daily.    . valACYclovir (VALTREX) 1000 MG tablet Take orally BID for a week prn 14 tablet 12  . vitamin C (ASCORBIC ACID) 500 MG tablet Take 500 mg by mouth daily.    . lansoprazole (PREVACID) 30 MG capsule Take 1 capsule (30 mg total) by mouth 2 (two) times daily before a meal. 90 capsule 2   No facility-administered medications prior to visit.     Allergies  Allergen Reactions  . Dexilant [Dexlansoprazole]     Sharp pains on stomach  . Pravastatin Other (See Comments)    msucle aches   . Sulfonamide Derivatives Other (See Comments)    Stomach pain.     ROS Review of Systems    Objective:    Physical Exam  BP 127/67   Pulse (!) 52   Ht 5\' 6"  (1.676 m)   Wt 183 lb (83 kg)   LMP 11/11/2013   SpO2 100%   BMI 29.54 kg/m  Wt Readings from Last 3 Encounters:  12/17/18 183 lb (83 kg)  05/14/18 185 lb (83.9 kg)  03/04/18 185 lb (83.9 kg)     There are no preventive care reminders to display for this patient.  There are no preventive care reminders to display for this patient.  Lab Results  Component Value Date   TSH 1.01 01/02/2016   Lab Results  Component Value Date   WBC 5.0 03/04/2018   HGB 13.0 03/04/2018   HCT 38.6 03/04/2018   MCV 90.2 03/04/2018   PLT 218 03/04/2018   Lab Results  Component Value Date  NA 142 03/04/2018   K 4.3 03/04/2018   CO2 30 03/04/2018   GLUCOSE 103 (H) 03/04/2018   BUN  10 03/04/2018   CREATININE 0.63 03/04/2018   BILITOT 0.6 03/04/2018   ALKPHOS 91 02/12/2017   AST 18 03/04/2018   ALT 16 03/04/2018   PROT 6.9 03/04/2018   ALBUMIN 4.6 02/12/2017   CALCIUM 9.5 03/04/2018   Lab Results  Component Value Date   CHOL 144 03/04/2018   Lab Results  Component Value Date   HDL 51 03/04/2018   Lab Results  Component Value Date   LDLCALC 74 03/04/2018   Lab Results  Component Value Date   TRIG 105 03/04/2018   Lab Results  Component Value Date   CHOLHDL 2.8 03/04/2018   No results found for: HGBA1C    Assessment & Plan:   Problem List Items Addressed This Visit      Digestive   GASTROESOPHAGEAL REFLUX, NO ESOPHAGITIS   Relevant Medications   sucralfate (CARAFATE) 1 GM/10ML suspension   lansoprazole (PREVACID) 30 MG capsule    Other Visit Diagnoses    Atypical chest pain    -  Primary   Relevant Orders   COMPLETE METABOLIC PANEL WITH GFR   TSH   CBC with Differential/Platelet   D-dimer, quantitative (not at [facility])   Troponin I   DG Chest 2 View   EKG 12-Lead   Jaw pain       Relevant Orders   COMPLETE METABOLIC PANEL WITH GFR   TSH   CBC with Differential/Platelet   D-dimer, quantitative (not at [facility])   Troponin I   DG Chest 2 View   Acute reaction to stress         Atypical chest pain-I do not think it is cardiac I think it is likely to be stress related but I do think we should do some work-up just to make sure.  I also think her reflux and hiatal hernia has been worse recently and that may be contributing to the discomfort as well.  EKG today shows rate of 52 bpm, sinus bradycardia with right bundle branch block.  Unchanged from previous from 2015 per our records.  Will get chest x-ray as well.  Also check thyroid as well as a CBC and troponin.  GERD-again increase her Prevacid to twice a day and add Carafate for short period of time to see if we can get some relief.   Jaw and shoulder pain  - likely MSK.  Work on heat and  gentle stretches.    Acute stress -is a lot on her plate right now it was several family members who are sick and not doing well.  Just encouraged her to make sure that she puts family is priority and if she needs to be out because of that that FMLA should cover her and encouraged her to use that if needed.  Meds ordered this encounter  Medications  . sucralfate (CARAFATE) 1 GM/10ML suspension    Sig: Take 10 mLs (1 g total) by mouth 4 (four) times daily -  with meals and at bedtime. OK to sub tabs.    Dispense:  420 mL    Refill:  1  . lansoprazole (PREVACID) 30 MG capsule    Sig: Take 1 capsule (30 mg total) by mouth 2 (two) times daily before a meal.    Dispense:  60 capsule    Refill:  1    Follow-up: Return if symptoms worsen or fail  to improve.    Beatrice Lecher, MD

## 2018-12-21 LAB — TIQ-NTM

## 2018-12-22 ENCOUNTER — Ambulatory Visit (INDEPENDENT_AMBULATORY_CARE_PROVIDER_SITE_OTHER): Payer: No Typology Code available for payment source | Admitting: Family Medicine

## 2018-12-22 ENCOUNTER — Telehealth: Payer: Self-pay

## 2018-12-22 ENCOUNTER — Encounter: Payer: Self-pay | Admitting: Family Medicine

## 2018-12-22 VITALS — BP 132/60 | HR 62 | Ht 66.0 in | Wt 183.0 lb

## 2018-12-22 DIAGNOSIS — F439 Reaction to severe stress, unspecified: Secondary | ICD-10-CM | POA: Diagnosis not present

## 2018-12-22 LAB — COMPLETE METABOLIC PANEL WITH GFR
AG Ratio: 1.6 (calc) (ref 1.0–2.5)
ALT: 17 U/L (ref 6–29)
AST: 20 U/L (ref 10–35)
Albumin: 4.6 g/dL (ref 3.6–5.1)
Alkaline phosphatase (APISO): 78 U/L (ref 37–153)
BUN: 12 mg/dL (ref 7–25)
CO2: 21 mmol/L (ref 20–32)
Calcium: 9.8 mg/dL (ref 8.6–10.4)
Chloride: 105 mmol/L (ref 98–110)
Creat: 0.71 mg/dL (ref 0.50–1.05)
GFR, Est African American: 110 mL/min/{1.73_m2} (ref >=60)
GFR, Est Non African American: 95 mL/min/{1.73_m2} (ref >=60)
Globulin: 2.9 g/dL (calc) (ref 1.9–3.7)
Glucose, Bld: 90 mg/dL (ref 65–99)
Potassium: 3.8 mmol/L (ref 3.5–5.3)
Sodium: 142 mmol/L (ref 135–146)
Total Bilirubin: 0.2 mg/dL (ref 0.2–1.2)
Total Protein: 7.5 g/dL (ref 6.1–8.1)

## 2018-12-22 LAB — TROPONIN I: Troponin I: <0.01 ng/mL (ref <=0.0)

## 2018-12-22 LAB — D-DIMER, QUANTITATIVE: D-Dimer, Quant: 0.22 mcg/mL FEU (ref <0.50)

## 2018-12-22 LAB — TSH: TSH: 1.26 mIU/L (ref 0.40–4.50)

## 2018-12-22 LAB — CBC WITH DIFFERENTIAL/PLATELET

## 2018-12-22 MED ORDER — CITALOPRAM HYDROBROMIDE 20 MG PO TABS: ORAL_TABLET | ORAL | 1 refills | Status: DC

## 2018-12-22 NOTE — Progress Notes (Signed)
Established Patient Office Visit  Subjective:  Patient ID: Tonya Banks, female    DOB: [birth year]  Age: 58 y.o. MRN: [id number]  CC:  Chief Complaint  Patient presents with  . mood    HPI Tonya Banks presents for Mood.  Tonya Banks came in about a week ago complaining of some chest pain and epigastric pain.  But she also reported a lot of stress with several family members who were not doing well.  And some increased rest was at work since her partner at work has been out for knee surgery.  She is just feeling really stressed and overwhelmed.  She is and she is had a lot going on in her life and has always been able to handle it but just feels like more recently she is just getting overwhelmed with the stress and anxiety of it.  Her work-up for the chest pain was essentially negative.  And she had sent a note back saying that she really felt like it was probably stress related and wanted to discuss treatment options.  Is never taken any type of antidepressant or antianxiety medication before.  He has decided that she is going to find a new job.  Her current job situation is just too stressful and she has made a definite decision to move forward with a new position.  She says she just finished up her resume.  No past medical history on file.  Past Surgical History:  Procedure Laterality Date  . APPENDECTOMY    . BREAST BIOPSY Left    stereo 2017  . breast lift  2011  . BREAST SURGERY    . REDUCTION MAMMAPLASTY Bilateral 1992  . TUBAL LIGATION    . tummy tuck    . uterine ablation  2000    Family History  Problem Relation Age of Onset  . Cancer Father        mesothelioma  . Gout Father   . Hypertension Mother     Social History   Socioeconomic History  . Marital status: Married    Spouse name: Not on file  . Number of children: 3  . Years of education: Not on file  . Highest education level: Not on file  Occupational History  . Not on file  Social Needs  .  Financial resource strain: Not on file  . Food insecurity:    Worry: Not on file    Inability: Not on file  . Transportation needs:    Medical: Not on file    Non-medical: Not on file  Tobacco Use  . Smoking status: Never Smoker  . Smokeless tobacco: Never Used  Substance and Sexual Activity  . Alcohol use: Yes    Alcohol/week: 0.0 - 1.0 standard drinks    Comment: month  . Drug use: No  . Sexual activity: Yes    Partners: Male    Birth control/protection: Post-menopausal  Lifestyle  . Physical activity:    Days per week: Not on file    Minutes per session: Not on file  . Stress: Not on file  Relationships  . Social connections:    Talks on phone: Not on file    Gets together: Not on file    Attends religious service: Not on file    Active member of club or organization: Not on file    Attends meetings of clubs or organizations: Not on file    Relationship status: Not on file  . Intimate partner violence:  Fear of current or ex partner: Not on file    Emotionally abused: Not on file    Physically abused: Not on file    Forced sexual activity: Not on file  Other Topics Concern  . Not on file  Social History Narrative   No regular exercise.      Outpatient Medications Prior to Visit  Medication Sig Dispense Refill  . calcium carbonate (OS-CAL) 600 MG TABS Take 600 mg by mouth 2 (two) times daily with a meal.      . Cholecalciferol (VITAMIN D PO) Take by mouth.    . Clobetasol Prop Emollient Base (CLOBETASOL PROPIONATE E) 0.05 % emollient cream Apply 1 application topically 2 (two) times daily. 30 g 0  . Coenzyme Q10 (CO Q 10 PO) Take 1 tablet by mouth daily.    . Diclofenac Sodium 3 % GEL Place 1 application onto the skin 2 (two) times daily as needed. 400 g 2  . fish oil-omega-3 fatty acids 1000 MG capsule Take 4 g by mouth daily.      Marland Kitchen gabapentin (NEURONTIN) 100 MG capsule TAKE 1-3 CAPSULES (100-300 MG TOTAL) BY MOUTH AT BEDTIME. 270 capsule 3  . lansoprazole  (PREVACID) 30 MG capsule Take 1 capsule (30 mg total) by mouth 2 (two) times daily before a meal. 60 capsule 1  . Pitavastatin Calcium (LIVALO) 2 MG TABS Take 1 tablet (2 mg total) by mouth at bedtime. 90 tablet 3  . POTASSIUM PO Take by mouth.    . sucralfate (CARAFATE) 1 GM/10ML suspension Take 10 mLs (1 g total) by mouth 4 (four) times daily -  with meals and at bedtime. OK to sub tabs. 420 mL 1  . Turmeric 500 MG TABS Take 1,000 mg by mouth daily.    . valACYclovir (VALTREX) 1000 MG tablet Take orally BID for a week prn 14 tablet 12  . vitamin C (ASCORBIC ACID) 500 MG tablet Take 500 mg by mouth daily.     No facility-administered medications prior to visit.     Allergies  Allergen Reactions  . Dexilant [Dexlansoprazole]     Sharp pains on stomach  . Pravastatin Other (See Comments)    msucle aches   . Sulfonamide Derivatives Other (See Comments)    Stomach pain.     ROS Review of Systems    Objective:    Physical Exam  BP 132/60   Pulse 62   Ht 5\' 6"  (1.676 m)   Wt 183 lb (83 kg)   LMP 11/11/2013   SpO2 100%   BMI 29.54 kg/m  Wt Readings from Last 3 Encounters:  12/22/18 183 lb (83 kg)  12/17/18 183 lb (83 kg)  05/14/18 185 lb (83.9 kg)     There are no preventive care reminders to display for this patient.  There are no preventive care reminders to display for this patient.  Lab Results  Component Value Date   TSH 1.26 12/17/2018   Lab Results  Component Value Date   WBC CANCELED 12/17/2018   HGB 13.0 03/04/2018   HCT 38.6 03/04/2018   MCV 90.2 03/04/2018   PLT 218 03/04/2018   Lab Results  Component Value Date   NA 142 12/17/2018   K 3.8 12/17/2018   CO2 21 12/17/2018   GLUCOSE 90 12/17/2018   BUN 12 12/17/2018   CREATININE 0.71 12/17/2018   BILITOT 0.2 12/17/2018   ALKPHOS 91 02/12/2017   AST 20 12/17/2018   ALT 17 12/17/2018  PROT 7.5 12/17/2018   ALBUMIN 4.6 02/12/2017   CALCIUM 9.8 12/17/2018   Lab Results  Component Value Date    CHOL 144 03/04/2018   Lab Results  Component Value Date   HDL 51 03/04/2018   Lab Results  Component Value Date   LDLCALC 74 03/04/2018   Lab Results  Component Value Date   TRIG 105 03/04/2018   Lab Results  Component Value Date   CHOLHDL 2.8 03/04/2018   No results found for: HGBA1C    Assessment & Plan:   Problem List Items Addressed This Visit    None    Visit Diagnoses    Situational stress    -  Primary      Acute situational stress-discussed options including starting medication, therapy/counseling, in addition to regular exercise to improve her mood.  She says she declines therapy and counseling right now.  She says she has done it in the past and found it extremely helpful but right now just feels like it would be 1 more thing added to her plate that she feels like she really cannot handle.  Also discussed medication options.  We will start with citalopram 10 mg for 10 days and increase to 20 mg.  She can contact us if at any point she experiences any concerns or symptoms or side effects.  We did review potential side effects of the medication.  Plan will be to follow-up in 2 to 3 weeks to make sure that she is doing well.   Meds ordered this encounter  Medications  . citalopram (CELEXA) 20 MG tablet    Sig: 1/2 tab po QD x 10 days then increase to whole tab.    Dispense:  30 tablet    Refill:  1    Follow-up: Return in about 3 weeks (around 01/12/2019) for mood.   Time spent 35 min, rater than 50% time spent counseling about acute situational stress.   Beatrice Lecher, MD

## 2018-12-22 NOTE — Progress Notes (Signed)
She reports feeling anxious all the time and can't get it under control. She's unsure if its because of what's going on with COVID that has caused her to feel more anxious or not.Tonya Banks, Tonya Banks, CMA

## 2018-12-22 NOTE — Telephone Encounter (Signed)
[facility] called and states the CBC was not ran due to the fact the lavender tube was sent to them frozen.

## 2018-12-29 ENCOUNTER — Other Ambulatory Visit: Payer: Self-pay | Admitting: Family Medicine

## 2018-12-29 DIAGNOSIS — Z1231 Encounter for screening mammogram for malignant neoplasm of breast: Secondary | ICD-10-CM

## 2019-01-11 ENCOUNTER — Ambulatory Visit (INDEPENDENT_AMBULATORY_CARE_PROVIDER_SITE_OTHER): Payer: PRIVATE HEALTH INSURANCE | Admitting: Family Medicine

## 2019-01-11 ENCOUNTER — Encounter: Payer: Self-pay | Admitting: Family Medicine

## 2019-01-11 VITALS — Temp 97.5°F | Ht 66.0 in | Wt 180.8 lb

## 2019-01-11 DIAGNOSIS — R0789 Other chest pain: Secondary | ICD-10-CM | POA: Diagnosis not present

## 2019-01-11 MED ORDER — CITALOPRAM HYDROBROMIDE 20 MG PO TABS: 20.0000 mg | ORAL_TABLET | Freq: Every day | ORAL | 1 refills | Status: DC

## 2019-01-11 NOTE — Progress Notes (Signed)
Pt taking a whole tab of Celexa on 01/02/2019. Doing well and can tell a difference with current regimen.Tonya Banks, [place]

## 2019-01-11 NOTE — Progress Notes (Signed)
Virtual Visit via Video Note  I connected with [name] on 01/11/19 at  3:00 PM EDT by a video enabled telemedicine application and verified that I am speaking with the correct person using two identifiers.   I discussed the limitations of evaluation and management by telemedicine and the availability of in person appointments. The patient expressed understanding and agreed to proceed.  She is at her work and I am in my office for the the virtual visit.   Subjective:    CC:   HPI: Tonya Banks is a 58 year old female who originally saw me on May 22 for some chest pain and epigastric pain after doing an initial work-up which was essentially negative.  I had her come back in to discuss stress and mood.  Had a lot going on in her life recently and was just feeling particularly overwhelmed.  Is been really unhappy at her current job and has decided that she wants to try to find a new job.  There have been several people out so that is also made it more stressful.  We discussed putting her on citalopram.  I had her take a half a tab for 10 days and she is now on a whole tab of 20 mg for the last 9 days.  So far she is actually been doing well on it without any significant side effects.  We also discussed therapy and counseling but she had declined at the time.  She was open to it overall but just did not feel like she had the time to commit to it currently.  She feels like her sleep has been better on the medication as well and she is been getting up to exercise regularly in the mornings.  She has started a walking routine.  She has lost 1-2 lbs.  Past medical history, Surgical history, Family history not pertinant except as noted below, Social history, Allergies, and medications have been entered into the medical record, reviewed, and corrections made.   Review of Systems: No fevers, chills, night sweats, weight loss, chest pain, or shortness of breath.   Objective:    General: Speaking clearly  in complete sentences without any shortness of breath.  Alert and oriented x3.  Normal judgment. No apparent acute distress. Well groomed.     Impression and Recommendations:    Acute situational stress.  She is actually doing really well overall.  She actually got a Psychologist, educational at work and that actually seems to have been a very positive thing.  Though she is still looking for a new job.  He is very happy with citalopram so far it seems to actually be really helpful and has not had any negative side effects.  We discussed staying at the current dose and then following up in 2 months to make sure that she is still doing well at that point in time.  She did asked that we go ahead and send over a 90-day supply to the pharmacy.      I discussed the assessment and treatment plan with the patient. The patient was provided an opportunity to ask questions and all were answered. The patient agreed with the plan and demonstrated an understanding of the instructions.   The patient was advised to call back or seek an in-person evaluation if the symptoms worsen or if the condition fails to improve as anticipated.   Beatrice Lecher, MD

## 2019-02-02 ENCOUNTER — Other Ambulatory Visit: Payer: Self-pay

## 2019-02-02 ENCOUNTER — Ambulatory Visit (INDEPENDENT_AMBULATORY_CARE_PROVIDER_SITE_OTHER): Payer: PRIVATE HEALTH INSURANCE

## 2019-02-02 DIAGNOSIS — Z1231 Encounter for screening mammogram for malignant neoplasm of breast: Secondary | ICD-10-CM

## 2019-02-03 ENCOUNTER — Other Ambulatory Visit: Payer: Self-pay | Admitting: Family Medicine

## 2019-02-03 DIAGNOSIS — R928 Other abnormal and inconclusive findings on diagnostic imaging of breast: Secondary | ICD-10-CM

## 2019-02-09 ENCOUNTER — Other Ambulatory Visit: Payer: Self-pay

## 2019-02-09 ENCOUNTER — Ambulatory Visit
Admission: RE | Admit: 2019-02-09 | Discharge: 2019-02-09 | Disposition: A | Discharge status: Home / Self Care | Payer: PRIVATE HEALTH INSURANCE | Source: Ambulatory Visit | Attending: Family Medicine | Admitting: Family Medicine

## 2019-02-09 DIAGNOSIS — R928 Other abnormal and inconclusive findings on diagnostic imaging of breast: Secondary | ICD-10-CM

## 2019-02-12 ENCOUNTER — Other Ambulatory Visit: Payer: Self-pay | Admitting: Family Medicine

## 2019-02-13 ENCOUNTER — Other Ambulatory Visit: Payer: Self-pay | Admitting: Family Medicine

## 2019-03-18 ENCOUNTER — Encounter: Payer: Self-pay | Admitting: Family Medicine

## 2019-03-18 ENCOUNTER — Ambulatory Visit (INDEPENDENT_AMBULATORY_CARE_PROVIDER_SITE_OTHER): Payer: PRIVATE HEALTH INSURANCE | Admitting: Family Medicine

## 2019-03-18 VITALS — Temp 97.4°F | Ht 66.0 in

## 2019-03-18 DIAGNOSIS — F439 Reaction to severe stress, unspecified: Secondary | ICD-10-CM

## 2019-03-18 DIAGNOSIS — F4321 Adjustment disorder with depressed mood: Secondary | ICD-10-CM

## 2019-03-18 NOTE — Progress Notes (Signed)
Pt reports that she is doing well on current medications. She stated that her step father passed 1.5 weeks ago and that has been on her mind.  Tonya Banks, [place] Crocker, CMA

## 2019-03-18 NOTE — Progress Notes (Signed)
Virtual Visit via Video Note  I connected with Tonya Banks on 03/18/19 at  3:00 PM EDT by a video enabled telemedicine application and verified that I am speaking with the correct person using two identifiers.   I discussed the limitations of evaluation and management by telemedicine and the availability of in person appointments. The patient expressed understanding and agreed to proceed.   Established Patient Office Visit  Subjective:  Patient ID: Tonya Banks, female    DOB: July 20, 1961  Age: 58 y.o. MRN: [id number]  CC: No chief complaint on file.   HPI Tonya Banks presents for follow-up situational stress.  Patient initially came in with complaints of chest pain and epigastric discomfort.  After negative work-up we discussed that she did have a lot of increase stress and anxieties.  I have followed her up in June at that time she was doing well on the new start of citalopram and had had a new boss at work which seemed to relieve some stress for her.  Her father actually passed away about a week and a half ago.  It was expected but it still been sad.  She has been trying to talk to her mother daily and make sure that she is doing okay and that she is safe.  [place]  But overall she feels like she is doing okay.  She is actually back to work.  She denies any side effects or problems with the medication.   No past medical history on file.  Past Surgical History:  Procedure Laterality Date  . APPENDECTOMY    . BREAST BIOPSY Left    stereo 2017  . breast lift  2011  . BREAST SURGERY    . REDUCTION MAMMAPLASTY Bilateral 1992  . TUBAL LIGATION    . tummy tuck    . uterine ablation  2000    Family History  Problem Relation Age of Onset  . Cancer Father        mesothelioma  . Gout Father   . Hypertension Mother     Social History   Socioeconomic History  . Marital status: Married    Spouse name: Not on file  . Number of children: 3  . Years of  education: Not on file  . Highest education level: Not on file  Occupational History  . Not on file  Social Needs  . Financial resource strain: Not on file  . Food insecurity    Worry: Not on file    Inability: Not on file  . Transportation needs    Medical: Not on file    Non-medical: Not on file  Tobacco Use  . Smoking status: Never Smoker  . Smokeless tobacco: Never Used  Substance and Sexual Activity  . Alcohol use: Yes    Alcohol/week: 0.0 - 1.0 standard drinks    Comment: month  . Drug use: No  . Sexual activity: Yes    Partners: Male    Birth control/protection: Post-menopausal  Lifestyle  . Physical activity    Days per week: Not on file    Minutes per session: Not on file  . Stress: Not on file  Relationships  . Social Herbalist on phone: Not on file    Gets together: Not on file    Attends religious service: Not on file    Active member of club or organization: Not on file    Attends meetings of clubs or organizations: Not  on file    Relationship status: Not on file  . Intimate partner violence    Fear of current or ex partner: Not on file    Emotionally abused: Not on file    Physically abused: Not on file    Forced sexual activity: Not on file  Other Topics Concern  . Not on file  Social History Narrative   No regular exercise.      Outpatient Medications Prior to Visit  Medication Sig Dispense Refill  . calcium carbonate (OS-CAL) 600 MG TABS Take 600 mg by mouth 2 (two) times daily with a meal.      . Cholecalciferol (VITAMIN D PO) Take by mouth.    . citalopram (CELEXA) 20 MG tablet Take 1 tablet (20 mg total) by mouth daily. 1/2 tab po QD x 10 days then increase to whole tab. 90 tablet 1  . Coenzyme Q10 (CO Q 10 PO) Take 1 tablet by mouth daily.    . Diclofenac Sodium 3 % GEL Place 1 application onto the skin 2 (two) times daily as needed. 400 g 2  . fish oil-omega-3 fatty acids 1000 MG capsule Take 4 g by mouth daily.      Marland Kitchen gabapentin  (NEURONTIN) 100 MG capsule TAKE 1-3 CAPSULES (100-300 MG TOTAL) BY MOUTH AT BEDTIME. 270 capsule 3  . lansoprazole (PREVACID) 30 MG capsule Take 1 capsule (30 mg total) by mouth 2 (two) times daily before a meal. 60 capsule 1  . LIVALO 2 MG TABS TAKE 1 TABLET (2 MG TOTAL) BY MOUTH AT BEDTIME. 90 tablet 3  . POTASSIUM PO Take by mouth.    . Turmeric 500 MG TABS Take 1,000 mg by mouth daily.    . valACYclovir (VALTREX) 1000 MG tablet Take orally BID for a week prn 14 tablet 12  . vitamin C (ASCORBIC ACID) 500 MG tablet Take 500 mg by mouth daily.     No facility-administered medications prior to visit.     Allergies  Allergen Reactions  . Dexilant [Dexlansoprazole]     Sharp pains on stomach  . Pravastatin Other (See Comments)    msucle aches   . Sulfonamide Derivatives Other (See Comments)    Stomach pain.     ROS Review of Systems    Objective:    Physical Exam  Temp (!) 97.4 F (36.3 C)   Ht 5\' 6"  (1.676 m)   LMP 11/11/2013   BMI 29.18 kg/m  Wt Readings from Last 3 Encounters:  01/11/19 180 lb 12.8 oz (82 kg)  12/22/18 183 lb (83 kg)  12/17/18 183 lb (83 kg)     Health Maintenance Due  Topic Date Due  . INFLUENZA VACCINE  02/26/2019    There are no preventive care reminders to display for this patient.  Lab Results  Component Value Date   TSH 1.26 12/17/2018   Lab Results  Component Value Date   WBC CANCELED 12/17/2018   HGB 13.0 03/04/2018   HCT 38.6 03/04/2018   MCV 90.2 03/04/2018   PLT 218 03/04/2018   Lab Results  Component Value Date   NA 142 12/17/2018   K 3.8 12/17/2018   CO2 21 12/17/2018   GLUCOSE 90 12/17/2018   BUN 12 12/17/2018   CREATININE 0.71 12/17/2018   BILITOT 0.2 12/17/2018   ALKPHOS 91 02/12/2017   AST 20 12/17/2018   ALT 17 12/17/2018   PROT 7.5 12/17/2018   ALBUMIN 4.6 02/12/2017   CALCIUM 9.8 12/17/2018  Lab Results  Component Value Date   CHOL 144 03/04/2018   Lab Results  Component Value Date   HDL 51  03/04/2018   Lab Results  Component Value Date   LDLCALC 74 03/04/2018   Lab Results  Component Value Date   TRIG 105 03/04/2018   Lab Results  Component Value Date   CHOLHDL 2.8 03/04/2018   No results found for: HGBA1C    Assessment & Plan:   Problem List Items Addressed This Visit    None    Visit Diagnoses    Situational stress    -  Primary   Grief         Situational stress/grief-overall doing well on her regimen she does want to make any adjustments today.  Offered to refer her to a therapist/counselor if she feels like any point that that would be helpful otherwise I will see her back in 6 months and we can go from there.  Did encourage her to get her flu shot this year.  No orders of the defined types were placed in this encounter.   Follow-up: Return in about 6 months (around 09/18/2019) for medication.  .   I discussed the assessment and treatment plan with the patient. The patient was provided an opportunity to ask questions and all were answered. The patient agreed with the plan and demonstrated an understanding of the instructions.   The patient was advised to call back or seek an in-person evaluation if the symptoms worsen or if the condition fails to improve as anticipated.  Time spent 15 minutes in non-face-to-face encounter  Beatrice Lecher, MD

## 2019-04-01 ENCOUNTER — Other Ambulatory Visit: Payer: Self-pay | Admitting: *Deleted

## 2019-04-01 MED ORDER — VALACYCLOVIR HCL 1 G PO TABS: ORAL_TABLET | ORAL | 6 refills | Status: DC

## 2019-04-01 NOTE — Telephone Encounter (Signed)
Rf authorization given per Dr Hulan Fray for Valtrex 1 GM.  RX sent to [facility]

## 2019-07-15 ENCOUNTER — Telehealth: Payer: Self-pay | Admitting: Family Medicine

## 2019-07-15 ENCOUNTER — Other Ambulatory Visit: Payer: Self-pay

## 2019-07-15 ENCOUNTER — Other Ambulatory Visit: Payer: Self-pay | Admitting: *Deleted

## 2019-07-15 MED ORDER — CITALOPRAM HYDROBROMIDE 20 MG PO TABS: 20.0000 mg | ORAL_TABLET | Freq: Every day | ORAL | 0 refills | Status: DC

## 2019-07-15 NOTE — Telephone Encounter (Signed)
Refilled med to [facility]

## 2019-08-08 ENCOUNTER — Other Ambulatory Visit: Payer: Self-pay

## 2019-08-08 ENCOUNTER — Encounter: Payer: Self-pay | Admitting: Family Medicine

## 2019-08-08 ENCOUNTER — Ambulatory Visit: Payer: PRIVATE HEALTH INSURANCE | Admitting: Family Medicine

## 2019-08-08 VITALS — BP 122/59 | HR 51 | Ht 66.0 in | Wt 175.0 lb

## 2019-08-08 DIAGNOSIS — F439 Reaction to severe stress, unspecified: Secondary | ICD-10-CM | POA: Diagnosis not present

## 2019-08-08 DIAGNOSIS — G629 Polyneuropathy, unspecified: Secondary | ICD-10-CM | POA: Diagnosis not present

## 2019-08-08 DIAGNOSIS — M79671 Pain in right foot: Secondary | ICD-10-CM

## 2019-08-08 DIAGNOSIS — K219 Gastro-esophageal reflux disease without esophagitis: Secondary | ICD-10-CM

## 2019-08-08 DIAGNOSIS — E7800 Pure hypercholesterolemia, unspecified: Secondary | ICD-10-CM

## 2019-08-08 NOTE — Assessment & Plan Note (Signed)
Doing great.

## 2019-08-08 NOTE — Progress Notes (Signed)
Established Patient Office Visit  Subjective:  Patient ID: Tonya Banks, female    DOB: [birth year]  Age: 60 y.o. MRN: [id number]  CC:  Chief Complaint  Patient presents with  . Hyperlipidemia    HPI Tonya Banks presents for 6 mo f/u  GERD - doing well on her medication.  Takes it daily.    Neuropathy -still continuing to have a lot of pain with walking in particular.  Her right foot is worse than her left. She has been using the gabapentin and it does seem to help.    Situational stress - currently on Celexa. She is doing well on current regimen.    No past medical history on file.  Past Surgical History:  Procedure Laterality Date  . APPENDECTOMY    . BREAST BIOPSY Left    stereo 2017  . breast lift  2011  . BREAST SURGERY    . REDUCTION MAMMAPLASTY Bilateral 1992  . TUBAL LIGATION    . tummy tuck    . uterine ablation  2000    Family History  Problem Relation Age of Onset  . Cancer Father        mesothelioma  . Gout Father   . Hypertension Mother     Social History   Socioeconomic History  . Marital status: Married    Spouse name: Not on file  . Number of children: 3  . Years of education: Not on file  . Highest education level: Not on file  Occupational History  . Not on file  Tobacco Use  . Smoking status: Never Smoker  . Smokeless tobacco: Never Used  Substance and Sexual Activity  . Alcohol use: Yes    Alcohol/week: 0.0 - 1.0 standard drinks    Comment: month  . Drug use: No  . Sexual activity: Yes    Partners: Male    Birth control/protection: Post-menopausal  Other Topics Concern  . Not on file  Social History Narrative   No regular exercise.     Social Determinants of Health   Financial Resource Strain:   . Difficulty of Paying Living Expenses: Not on file  Food Insecurity:   . Worried About Charity fundraiser in the Last Year: Not on file  . Ran Out of Food in the Last Year: Not on file  Transportation Needs:   . Lack  of Transportation (Medical): Not on file  . Lack of Transportation (Non-Medical): Not on file  Physical Activity:   . Days of Exercise per Week: Not on file  . Minutes of Exercise per Session: Not on file  Stress:   . Feeling of Stress : Not on file  Social Connections:   . Frequency of Communication with Friends and Family: Not on file  . Frequency of Social Gatherings with Friends and Family: Not on file  . Attends Religious Services: Not on file  . Active Member of Clubs or Organizations: Not on file  . Attends Archivist Meetings: Not on file  . Marital Status: Not on file  Intimate Partner Violence:   . Fear of Current or Ex-Partner: Not on file  . Emotionally Abused: Not on file  . Physically Abused: Not on file  . Sexually Abused: Not on file    Outpatient Medications Prior to Visit  Medication Sig Dispense Refill  . calcium carbonate (OS-CAL) 600 MG TABS Take 600 mg by mouth 2 (two) times daily with a meal.      .  Cholecalciferol (VITAMIN D PO) Take by mouth.    . citalopram (CELEXA) 20 MG tablet Take 1 tablet (20 mg total) by mouth daily. Call patient when RX ready 90 tablet 0  . Coenzyme Q10 (CO Q 10 PO) Take 1 tablet by mouth daily.    . Diclofenac Sodium 3 % GEL Place 1 application onto the skin 2 (two) times daily as needed. 400 g 2  . fish oil-omega-3 fatty acids 1000 MG capsule Take 4 g by mouth daily.      Marland Kitchen gabapentin (NEURONTIN) 100 MG capsule TAKE 1-3 CAPSULES (100-300 MG TOTAL) BY MOUTH AT BEDTIME. 270 capsule 3  . lansoprazole (PREVACID) 30 MG capsule Take 1 capsule (30 mg total) by mouth 2 (two) times daily before a meal. 60 capsule 1  . LIVALO 2 MG TABS TAKE 1 TABLET (2 MG TOTAL) BY MOUTH AT BEDTIME. 90 tablet 3  . POTASSIUM PO Take by mouth.    . Turmeric 500 MG TABS Take 1,000 mg by mouth daily.    . valACYclovir (VALTREX) 1000 MG tablet Take orally BID for a week prn 14 tablet 6  . vitamin C (ASCORBIC ACID) 500 MG tablet Take 500 mg by mouth  daily.    . citalopram (CELEXA) 20 MG tablet Take 1 tablet (20 mg total) by mouth daily. Call patient when RX ready 30 tablet 0   No facility-administered medications prior to visit.    Allergies  Allergen Reactions  . Dexilant [Dexlansoprazole]     Sharp pains on stomach  . Pravastatin Other (See Comments)    msucle aches   . Sulfonamide Derivatives Other (See Comments)    Stomach pain.     ROS Review of Systems    Objective:    Physical Exam  Constitutional: She is oriented to person, place, and time. She appears well-developed and well-nourished.  HENT:  Head: Normocephalic and atraumatic.  Cardiovascular: Normal rate, regular rhythm and normal heart sounds.  Pulmonary/Chest: Effort normal and breath sounds normal.  Neurological: She is alert and oriented to person, place, and time.  Skin: Skin is warm and dry.  Psychiatric: She has a normal mood and affect. Her behavior is normal.    BP (!) 122/59   Pulse (!) 51   Ht 5\' 6"  (1.676 m)   Wt 175 lb (79.4 kg)   LMP 11/11/2013   SpO2 99%   BMI 28.25 kg/m  Wt Readings from Last 3 Encounters:  08/08/19 175 lb (79.4 kg)  01/11/19 180 lb 12.8 oz (82 kg)  12/22/18 183 lb (83 kg)     There are no preventive care reminders to display for this patient.  There are no preventive care reminders to display for this patient.  Lab Results  Component Value Date   TSH 1.26 12/17/2018   Lab Results  Component Value Date   WBC CANCELED 12/17/2018   HGB 13.0 03/04/2018   HCT 38.6 03/04/2018   MCV 90.2 03/04/2018   PLT 218 03/04/2018   Lab Results  Component Value Date   NA 142 12/17/2018   K 3.8 12/17/2018   CO2 21 12/17/2018   GLUCOSE 90 12/17/2018   BUN 12 12/17/2018   CREATININE 0.71 12/17/2018   BILITOT 0.2 12/17/2018   ALKPHOS 91 02/12/2017   AST 20 12/17/2018   ALT 17 12/17/2018   PROT 7.5 12/17/2018   ALBUMIN 4.6 02/12/2017   CALCIUM 9.8 12/17/2018   Lab Results  Component Value Date   CHOL 144  03/04/2018  Lab Results  Component Value Date   HDL 51 03/04/2018   Lab Results  Component Value Date   LDLCALC 74 03/04/2018   Lab Results  Component Value Date   TRIG 105 03/04/2018   Lab Results  Component Value Date   CHOLHDL 2.8 03/04/2018   No results found for: HGBA1C    Assessment & Plan:   Problem List Items Addressed This Visit      Digestive   GASTROESOPHAGEAL REFLUX, NO ESOPHAGITIS    Doing great!!          Nervous and Auditory   Neuropathy    Continue gabapentin.        Relevant Orders   Ambulatory referral to Podiatry     Other   Situational stress - Primary    Continue with current regimen. F/U in 4-6 mo.        HYPERCHOLESTEROLEMIA    Will have to see if we can get Livalo covered.  Initiate PA. Continue for now. Check labs.       Relevant Orders   Lipid Panel w/reflex Direct LDL   COMPLETE METABOLIC PANEL WITH GFR    Other Visit Diagnoses    Foot pain, right       Relevant Orders   Ambulatory referral to Podiatry     Right foot pain - Refer to Podiatry. She is interested In light therapy. May also have mechanical issues that can addressed as well.    No orders of the defined types were placed in this encounter.   Follow-up: Return in about 6 months (around 02/05/2020) for Neuropathy .    Beatrice Lecher, MD

## 2019-08-08 NOTE — Assessment & Plan Note (Signed)
Continue gabapentin.

## 2019-08-08 NOTE — Assessment & Plan Note (Signed)
Continue with current regimen. F/U in 4-6 mo.

## 2019-08-08 NOTE — Assessment & Plan Note (Addendum)
Will have to see if we can get Livalo covered.  Initiate PA. Continue for now. Check labs.

## 2019-08-08 NOTE — Progress Notes (Signed)
Pt has new insurance and stated that they will not cover the Livalo. She said that this is the only medication that she has been able to take w/o any side effects(joint pain)

## 2019-08-09 ENCOUNTER — Telehealth: Payer: Self-pay | Admitting: Family Medicine

## 2019-08-09 LAB — COMPLETE METABOLIC PANEL WITH GFR
AG Ratio: 1.6 (calc) (ref 1.0–2.5)
ALT: 17 U/L (ref 6–29)
AST: 17 U/L (ref 10–35)
Albumin: 4.4 g/dL (ref 3.6–5.1)
Alkaline phosphatase (APISO): 77 U/L (ref 37–153)
BUN: 11 mg/dL (ref 7–25)
CO2: 31 mmol/L (ref 20–32)
Calcium: 9.6 mg/dL (ref 8.6–10.4)
Chloride: 106 mmol/L (ref 98–110)
Creat: 0.71 mg/dL (ref 0.50–1.05)
GFR, Est African American: 110 mL/min/{1.73_m2} (ref >=60)
GFR, Est Non African American: 95 mL/min/{1.73_m2} (ref >=60)
Globulin: 2.7 g/dL (calc) (ref 1.9–3.7)
Glucose, Bld: 101 mg/dL — ABNORMAL HIGH (ref 65–99)
Potassium: 4.5 mmol/L (ref 3.5–5.3)
Sodium: 141 mmol/L (ref 135–146)
Total Bilirubin: 0.5 mg/dL (ref 0.2–1.2)
Total Protein: 7.1 g/dL (ref 6.1–8.1)

## 2019-08-09 LAB — LIPID PANEL W/REFLEX DIRECT LDL
Cholesterol: 185 mg/dL (ref <200)
HDL: 52 mg/dL (ref >=50)
LDL Cholesterol (Calc): 108 mg/dL (calc) — ABNORMAL HIGH
Non-HDL Cholesterol (Calc): 133 mg/dL (calc) — ABNORMAL HIGH (ref <130)
Total CHOL/HDL Ratio: 3.6 (calc) (ref <5.0)
Triglycerides: 134 mg/dL (ref <150)

## 2019-08-09 NOTE — Telephone Encounter (Signed)
Received fax from [employer] that livalo requires a PA. Information has been sent to the insurance company. Awaiting determination.

## 2019-08-10 NOTE — Telephone Encounter (Signed)
Received a fax from [employer] that Livalo is approved through 08/09/2019 through 08/08/2020. Pharmacy aware and forms sent to scan.

## 2019-08-15 ENCOUNTER — Encounter: Payer: Self-pay | Admitting: Podiatry

## 2019-08-15 ENCOUNTER — Ambulatory Visit (INDEPENDENT_AMBULATORY_CARE_PROVIDER_SITE_OTHER): Payer: PRIVATE HEALTH INSURANCE

## 2019-08-15 ENCOUNTER — Other Ambulatory Visit: Payer: Self-pay

## 2019-08-15 ENCOUNTER — Ambulatory Visit: Payer: PRIVATE HEALTH INSURANCE | Admitting: Podiatry

## 2019-08-15 DIAGNOSIS — M722 Plantar fascial fibromatosis: Secondary | ICD-10-CM | POA: Diagnosis not present

## 2019-08-15 DIAGNOSIS — M779 Enthesopathy, unspecified: Secondary | ICD-10-CM

## 2019-08-15 DIAGNOSIS — M7751 Other enthesopathy of right foot: Secondary | ICD-10-CM

## 2019-08-15 DIAGNOSIS — G5761 Lesion of plantar nerve, right lower limb: Secondary | ICD-10-CM | POA: Diagnosis not present

## 2019-08-15 DIAGNOSIS — D3610 Benign neoplasm of peripheral nerves and autonomic nervous system, unspecified: Secondary | ICD-10-CM

## 2019-08-15 MED ORDER — MELOXICAM 15 MG PO TABS: 15.0000 mg | ORAL_TABLET | Freq: Every day | ORAL | 0 refills | Status: DC

## 2019-08-15 NOTE — Progress Notes (Signed)
Subjective:   Patient ID: Tonya Banks, female   DOB: 59 y.o.   MRN: [id number]   HPI 59 year old female presents the office today for concerns of possible neuropathy in the right foot as well as left foot plantar fasciitis.  She states that she is had plantar fasciitis in the left side but she has not had an injection for about 5 years.  She states on the right side she gets numbness and tingling into the second, third, fourth toes.  She states that she has been on gabapentin which does not help much.  She also was on a B complex vitamin which is not helpful.  She denies any recent injury.  No rating pain or weakness.   Review of Systems  All other systems reviewed and are negative.  History reviewed. No pertinent past medical history.  Past Surgical History:  Procedure Laterality Date  . APPENDECTOMY    . BREAST BIOPSY Left    stereo 2017  . breast lift  2011  . BREAST SURGERY    . REDUCTION MAMMAPLASTY Bilateral 1992  . TUBAL LIGATION    . tummy tuck    . uterine ablation  2000     Current Outpatient Medications:  .  calcium carbonate (OS-CAL) 600 MG TABS, Take 600 mg by mouth 2 (two) times daily with a meal.  , Disp: , Rfl:  .  Cholecalciferol (VITAMIN D PO), Take by mouth., Disp: , Rfl:  .  citalopram (CELEXA) 20 MG tablet, Take 1 tablet (20 mg total) by mouth daily. Call patient when RX ready, Disp: 90 tablet, Rfl: 0 .  Coenzyme Q10 (CO Q 10 PO), Take 1 tablet by mouth daily., Disp: , Rfl:  .  Diclofenac Sodium 3 % GEL, Place 1 application onto the skin 2 (two) times daily as needed., Disp: 400 g, Rfl: 2 .  fish oil-omega-3 fatty acids 1000 MG capsule, Take 4 g by mouth daily.  , Disp: , Rfl:  .  gabapentin (NEURONTIN) 100 MG capsule, TAKE 1-3 CAPSULES (100-300 MG TOTAL) BY MOUTH AT BEDTIME., Disp: 270 capsule, Rfl: 3 .  lansoprazole (PREVACID) 30 MG capsule, Take 1 capsule (30 mg total) by mouth 2 (two) times daily before a meal., Disp: 60 capsule, Rfl: 1 .  LIVALO 2 MG  TABS, TAKE 1 TABLET (2 MG TOTAL) BY MOUTH AT BEDTIME., Disp: 90 tablet, Rfl: 3 .  meloxicam (MOBIC) 15 MG tablet, Take 1 tablet (15 mg total) by mouth daily., Disp: 30 tablet, Rfl: 0 .  POTASSIUM PO, Take by mouth., Disp: , Rfl:  .  Turmeric 500 MG TABS, Take 1,000 mg by mouth daily., Disp: , Rfl:  .  valACYclovir (VALTREX) 1000 MG tablet, Take orally BID for a week prn, Disp: 14 tablet, Rfl: 6 .  vitamin C (ASCORBIC ACID) 500 MG tablet, Take 500 mg by mouth daily., Disp: , Rfl:   Allergies  Allergen Reactions  . Dexilant [Dexlansoprazole]     Sharp pains on stomach  . Pravastatin Other (See Comments)    msucle aches   . Sulfonamide Derivatives Other (See Comments)    Stomach pain.           Objective:  Physical Exam  General: AAO x3, NAD  Dermatological: Skin is warm, dry and supple bilateral. Nails x 10 are well manicured; remaining integument appears unremarkable at this time. There are no open sores, no preulcerative lesions, no rash or signs of infection present.  Vascular: Dorsalis Pedis artery and  Posterior Tibial artery pedal pulses are 2/4 bilateral with immedate capillary fill time. Pedal hair growth present. No varicosities and no lower extremity edema present bilateral. There is no pain with calf compression, swelling, warmth, erythema.   Neruologic: Grossly intact via light touch bilateral. Vibratory intact via tuning fork bilateral. Protective threshold with Semmes Wienstein monofilament intact to all pedal sites bilateral. Patellar and Achilles deep tendon reflexes 2+ bilateral. No Babinski or clonus noted bilateral.   Musculoskeletal: There is tenderness palpation along the third interspace and small palpable neuromas identified.  No significant discomfort of the second interspace and no palpable neuroma.  No other area of pinpoint tenderness on the right foot.  Left foot there is tenderness on the plantar medial tubercle of the calcaneus at the insertion of plantar  fascia.  The plantar fascia appears to be intact.  No pain with lateral compression of calcaneus along the course of the Achilles tendon.  Muscular strength 5/5 in all groups tested bilateral.  Gait: Unassisted, Nonantalgic.       Assessment:   Left foot plantar fascias, right foot neuroma     Plan:  -Treatment options discussed including all alternatives, risks, and complications -Etiology of symptoms were discussed -X-rays were obtained and reviewed with the patient.  Spur present on the left side worse than right.  There is no evidence of acute fracture. -On the right side I do not believe this is a true neuropathy.  I do think this is coming more from a neuroma.  Discussed an ultrasound but we have decided to do a steroid injection today.  See procedure note below.  If no improvement will order an ultrasound to evaluate for neuroma.  If negative then will do a nerve conduction test.  Neuroma pads dispensed. -On the left side plantar fascial injection performed.  See procedure note below.  Continue with stretching, icing as well as wearing supportive shoes. -Prescribed mobic. Discussed side effects of the medication and directed to stop if any are to occur and call the office.   Procedure: Injection Morton's neuroma Discussed alternatives, risks, complications and verbal consent was obtained.  Location: Right interspace neuroma Skin Prep: Alcohol. Injectate: 0.5cc 0.5% marcaine plain, 0.5 cc 2% lidocaine plain and, 1 cc kenalog 10. Disposition: Patient tolerated procedure well. Injection site dressed with a band-aid.  Post-injection care was discussed and return precautions discussed.   Procedure: Injection Tendon/Ligament Discussed alternatives, risks, complications and verbal consent was obtained.  Location: Left plantar fascia  Skin Prep: Alcohol. Injectate: 0.5cc 0.5% marcaine plain, 0.5 cc 2% lidocaine plain and, 1 cc kenalog 10. Disposition: Patient tolerated procedure well.  Injection site dressed with a band-aid.  Post-injection care was discussed and return precautions discussed.   No follow-ups on file.  Trula Slade DPM

## 2019-08-16 DIAGNOSIS — M722 Plantar fascial fibromatosis: Secondary | ICD-10-CM | POA: Insufficient documentation

## 2019-08-16 DIAGNOSIS — D3610 Benign neoplasm of peripheral nerves and autonomic nervous system, unspecified: Secondary | ICD-10-CM | POA: Insufficient documentation

## 2019-09-11 ENCOUNTER — Other Ambulatory Visit: Payer: Self-pay | Admitting: Podiatry

## 2019-09-12 NOTE — Telephone Encounter (Signed)
[facility]ill of Meloxicam 15mg  [facility] [place]

## 2019-10-05 ENCOUNTER — Other Ambulatory Visit: Payer: Self-pay | Admitting: Family Medicine

## 2019-10-11 ENCOUNTER — Other Ambulatory Visit: Payer: Self-pay | Admitting: Podiatry

## 2019-10-11 NOTE — Telephone Encounter (Signed)
Meloxicam refilled pt needs an appt prior to future refills.

## 2019-12-22 ENCOUNTER — Telehealth: Payer: Self-pay | Admitting: Family Medicine

## 2019-12-22 NOTE — Telephone Encounter (Signed)
Call pt: received request for msucle relaxer from pharmacy.  Request was for flexeril and metaxalone.  Which one would she like?  Please pick one

## 2019-12-23 MED ORDER — METAXALONE 800 MG PO TABS: 800.0000 mg | ORAL_TABLET | Freq: Two times a day (BID) | ORAL | 0 refills | Status: DC | PRN

## 2019-12-23 NOTE — Telephone Encounter (Signed)
rx sent

## 2019-12-23 NOTE — Telephone Encounter (Signed)
Patient advised.

## 2019-12-23 NOTE — Telephone Encounter (Signed)
Left pt msg to call back and let us know which she is taking

## 2019-12-23 NOTE — Telephone Encounter (Signed)
Patient states she was using both because flexeril made her sleepy so she took it at night and took [facility] during the day.   Metaxalone is what she chooses if she has to pick one. Patient reports that she takes about 1 daily whenever it is needed, which is sometimes only once a month. States she is OK with just an RX for a #15.  I have made sure that flexeril has been removed from med list and pended RX for Metaxalone. Please send if OK. RX to [facility].

## 2020-01-03 ENCOUNTER — Other Ambulatory Visit: Payer: Self-pay | Admitting: *Deleted

## 2020-01-03 MED ORDER — CYCLOBENZAPRINE HCL 10 MG PO TABS: ORAL_TABLET | ORAL | 0 refills | Status: DC

## 2020-02-03 ENCOUNTER — Other Ambulatory Visit: Payer: Self-pay | Admitting: Family Medicine

## 2020-02-03 DIAGNOSIS — Z1231 Encounter for screening mammogram for malignant neoplasm of breast: Secondary | ICD-10-CM

## 2020-02-08 ENCOUNTER — Other Ambulatory Visit: Payer: Self-pay | Admitting: Family Medicine

## 2020-02-15 ENCOUNTER — Other Ambulatory Visit: Payer: Self-pay

## 2020-02-15 ENCOUNTER — Ambulatory Visit (INDEPENDENT_AMBULATORY_CARE_PROVIDER_SITE_OTHER): Payer: 59

## 2020-02-15 DIAGNOSIS — Z1231 Encounter for screening mammogram for malignant neoplasm of breast: Secondary | ICD-10-CM

## 2020-03-02 ENCOUNTER — Other Ambulatory Visit: Payer: Self-pay

## 2020-03-02 ENCOUNTER — Encounter: Payer: Self-pay | Admitting: Podiatry

## 2020-03-02 ENCOUNTER — Ambulatory Visit: Payer: PRIVATE HEALTH INSURANCE | Admitting: Podiatry

## 2020-03-02 VITALS — Temp 98.7°F

## 2020-03-02 DIAGNOSIS — D3610 Benign neoplasm of peripheral nerves and autonomic nervous system, unspecified: Secondary | ICD-10-CM

## 2020-03-02 DIAGNOSIS — M722 Plantar fascial fibromatosis: Secondary | ICD-10-CM | POA: Diagnosis not present

## 2020-03-02 DIAGNOSIS — G5761 Lesion of plantar nerve, right lower limb: Secondary | ICD-10-CM

## 2020-03-02 NOTE — Progress Notes (Signed)
Subjective: 59 year old female presents the office today for concerns of continued pain in her left heel as well as right foot.  She is injection right foot helped quite a bit however injection left did not help much.  She states that when she is of the same she previously she had had 2 injections in the left foot.  She wants to discuss other treatment options including EPAT.  She does not go barefoot and she wears supportive shoes and also inserts and tried stretching.  She has been working more overtime recently.  No recent injury. Denies any systemic complaints such as fevers, chills, nausea, vomiting. No acute changes since last appointment, and no other complaints at this time.   Hopefully going on a cruise to [place]  Objective: AAO x3, NAD DP/PT pulses palpable bilaterally, CRT less than 3 seconds Tenderness to palpation along the plantar medial tubercle of the calcaneus at the insertion of plantar fascia on the left foot. There is no pain along the course of the plantar fascia within the arch of the foot. Plantar fascia appears to be intact. There is no pain with lateral compression of the calcaneus or pain with vibratory sensation. There is no pain along the course or insertion of the achilles tendon.  Tenderness on the right foot second interspace.  No pinpoint tenderness on the right side.  No other areas of tenderness to bilateral lower extremities. No pain with calf compression, swelling, warmth, erythema  Assessment: Left heel pain, plantar fasciitis, bone spur; right second interspace neuroma  Plan: -All treatment options discussed with the patient including all alternatives, risks, complications.  -Sterile injection performed bilaterally.  See procedure note -She has voltaren gel at home. Mobic has been causing stomach upset so we are going to try to hold off on that -Continue stretching, icing, orthotics. -Discussed EPAT-she is to see how this injection does for proceeding with  that. -Also discussed surgical intervention in the future if needed. -Patient encouraged to call the office with any questions, concerns, change in symptoms.   Procedure: Injection Tendon/Ligament Discussed alternatives, risks, complications and verbal consent was obtained.  Location: LEFT plantar fascia at the glabrous junction; medial approach. Skin Prep: Alcohol  Injectate: 0.5cc 0.5% marcaine plain, 0.5 cc 2% lidocaine plain and, 1 cc kenalog 10. Disposition: Patient tolerated procedure well. Injection site dressed with a band-aid.  Post-injection care was discussed and return precautions discussed.   Procedure: Injection Neuroma Discussed alternatives, risks, complications and verbal consent was obtained.  Location: Right 2nd interspace  Skin Prep: Alcohol  Injectate: 0.5cc 0.5% marcaine plain, 0.5 cc 2% lidocaine plain and, 1 cc kenalog 10. Disposition: Patient tolerated procedure well. Injection site dressed with a band-aid.  Post-injection care was discussed and return precautions discussed.    Trula Slade DPM

## 2020-03-11 ENCOUNTER — Encounter: Payer: Self-pay | Admitting: Family Medicine

## 2020-04-12 ENCOUNTER — Ambulatory Visit (INDEPENDENT_AMBULATORY_CARE_PROVIDER_SITE_OTHER): Payer: 59 | Admitting: Family Medicine

## 2020-04-12 ENCOUNTER — Other Ambulatory Visit: Payer: Self-pay

## 2020-04-12 ENCOUNTER — Encounter: Payer: Self-pay | Admitting: Family Medicine

## 2020-04-12 VITALS — BP 143/55 | HR 51 | Ht 66.0 in | Wt 179.0 lb

## 2020-04-12 DIAGNOSIS — F439 Reaction to severe stress, unspecified: Secondary | ICD-10-CM | POA: Diagnosis not present

## 2020-04-12 DIAGNOSIS — Z0000 Encounter for general adult medical examination without abnormal findings: Secondary | ICD-10-CM

## 2020-04-12 DIAGNOSIS — Z23 Encounter for immunization: Secondary | ICD-10-CM

## 2020-04-12 MED ORDER — CITALOPRAM HYDROBROMIDE 20 MG PO TABS: 20.0000 mg | ORAL_TABLET | Freq: Every day | ORAL | 0 refills | Status: DC

## 2020-04-12 MED ORDER — DICLOFENAC SODIUM 3 % EX GEL: 1.0000 "application " | Freq: Two times a day (BID) | CUTANEOUS | 2 refills | Status: DC

## 2020-04-12 NOTE — Assessment & Plan Note (Signed)
She had originally tried to taper off of the Celexa but did restart it.  She just felt like there are a lot of stressful things going on but would like to try at some point in the future to come back off of it.

## 2020-04-12 NOTE — Patient Instructions (Signed)
Health Maintenance, Female Adopting a healthy lifestyle and getting preventive care are important in promoting health and wellness. Ask your health care provider about:  The right schedule for you to have regular tests and exams.  Things you can do on your own to prevent diseases and keep yourself healthy. What should I know about diet, weight, and exercise? Eat a healthy diet   Eat a diet that includes plenty of vegetables, fruits, low-fat dairy products, and lean protein.  Do not eat a lot of foods that are high in solid fats, added sugars, or sodium. Maintain a healthy weight Body mass index (BMI) is used to identify weight problems. It estimates body fat based on height and weight. Your health care provider can help determine your BMI and help you achieve or maintain a healthy weight. Get regular exercise Get regular exercise. This is one of the most important things you can do for your health. Most adults should:  Exercise for at least 150 minutes each week. The exercise should increase your heart rate and make you sweat (moderate-intensity exercise).  Do strengthening exercises at least twice a week. This is in addition to the moderate-intensity exercise.  Spend less time sitting. Even light physical activity can be beneficial. Watch cholesterol and blood lipids Have your blood tested for lipids and cholesterol at 58 years of age, then have this test every 5 years. Have your cholesterol levels checked more often if:  Your lipid or cholesterol levels are high.  You are older than 58 years of age.  You are at high risk for heart disease. What should I know about cancer screening? Depending on your health history and family history, you may need to have cancer screening at various ages. This may include screening for:  Breast cancer.  Cervical cancer.  Colorectal cancer.  Skin cancer.  Lung cancer. What should I know about heart disease, diabetes, and high blood  pressure? Blood pressure and heart disease  High blood pressure causes heart disease and increases the risk of stroke. This is more likely to develop in people who have high blood pressure readings, are of African descent, or are overweight.  Have your blood pressure checked: ? Every 3-5 years if you are 18-39 years of age. ? Every year if you are 40 years old or older. Diabetes Have regular diabetes screenings. This checks your fasting blood sugar level. Have the screening done:  Once every three years after age 40 if you are at a normal weight and have a low risk for diabetes.  More often and at a younger age if you are overweight or have a high risk for diabetes. What should I know about preventing infection? Hepatitis B If you have a higher risk for hepatitis B, you should be screened for this virus. Talk with your health care provider to find out if you are at risk for hepatitis B infection. Hepatitis C Testing is recommended for:  Everyone born from 1945 through 1965.  Anyone with known risk factors for hepatitis C. Sexually transmitted infections (STIs)  Get screened for STIs, including gonorrhea and chlamydia, if: ? You are sexually active and are younger than 58 years of age. ? You are older than 58 years of age and your health care provider tells you that you are at risk for this type of infection. ? Your sexual activity has changed since you were last screened, and you are at increased risk for chlamydia or gonorrhea. Ask your health care provider if   you are at risk.  Ask your health care provider about whether you are at high risk for HIV. Your health care provider may recommend a prescription medicine to help prevent HIV infection. If you choose to take medicine to prevent HIV, you should first get tested for HIV. You should then be tested every 3 months for as long as you are taking the medicine. Pregnancy  If you are about to stop having your period (premenopausal) and  you may become pregnant, seek counseling before you get pregnant.  Take 400 to 800 micrograms (mcg) of folic acid every day if you become pregnant.  Ask for birth control (contraception) if you want to prevent pregnancy. Osteoporosis and menopause Osteoporosis is a disease in which the bones lose minerals and strength with aging. This can result in bone fractures. If you are 65 years old or older, or if you are at risk for osteoporosis and fractures, ask your health care provider if you should:  Be screened for bone loss.  Take a calcium or vitamin D supplement to lower your risk of fractures.  Be given hormone replacement therapy (HRT) to treat symptoms of menopause. Follow these instructions at home: Lifestyle  Do not use any products that contain nicotine or tobacco, such as cigarettes, e-cigarettes, and chewing tobacco. If you need help quitting, ask your health care provider.  Do not use street drugs.  Do not share needles.  Ask your health care provider for help if you need support or information about quitting drugs. Alcohol use  Do not drink alcohol if: ? Your health care provider tells you not to drink. ? You are pregnant, may be pregnant, or are planning to become pregnant.  If you drink alcohol: ? Limit how much you use to 0-1 drink a day. ? Limit intake if you are breastfeeding.  Be aware of how much alcohol is in your drink. In the [place] one drink equals one 12 oz bottle of beer (355 mL), one 5 oz glass of wine (148 mL), or one 1 oz glass of hard liquor (44 mL). General instructions  Schedule regular health, dental, and eye exams.  Stay current with your vaccines.  Tell your health care provider if: ? You often feel depressed. ? You have ever been abused or do not feel safe at home. Summary  Adopting a healthy lifestyle and getting preventive care are important in promoting health and wellness.  Follow your health care provider's instructions about healthy  diet, exercising, and getting tested or screened for diseases.  Follow your health care provider's instructions on monitoring your cholesterol and blood pressure. This information is not intended to replace advice given to you by your health care provider. Make sure you discuss any questions you have with your health care provider. Document Revised: 07/07/2018 Document Reviewed: 07/07/2018 Elsevier Patient Education  2020 Elsevier Inc.  

## 2020-04-12 NOTE — Progress Notes (Signed)
Subjective:     Tonya Banks is a 59 y.o. female and is here for a comprehensive physical exam. The patient reports no problems.  She has been under a little bit more stress recently.  At work they are Engineer, materials systems.  Her mother who is now in her 50s is having some health issues but lives in [place] so she just feels like she does not have a lot of ability to help her.  She said she is glad to be able to have a week a month to be able to go down to [place] to visit her and help take care of her.  Is have a spot on her right anterior ankle she would like me to look at today she is already seen the dermatologist 6-year but forgot to mention it to them.  She says it is actually been there for a long time.  Social History   Socioeconomic History   Marital status: Married    Spouse name: Not on file   Number of children: 3   Years of education: Not on file   Highest education level: Not on file  Occupational History   Not on file  Tobacco Use   Smoking status: Never Smoker   Smokeless tobacco: Never Used  Substance and Sexual Activity   Alcohol use: Yes    Alcohol/week: 0.0 - 1.0 standard drinks    Comment: month   Drug use: No   Sexual activity: Yes    Partners: Male    Birth control/protection: Post-menopausal  Other Topics Concern   Not on file  Social History Narrative   No regular exercise.     Social Determinants of Health   Financial Resource Strain:    Difficulty of Paying Living Expenses: Not on file  Food Insecurity:    Worried About Charity fundraiser in the Last Year: Not on file   [employer] of Food in the Last Year: Not on file  Transportation Needs:    Lack of Transportation (Medical): Not on file   Lack of Transportation (Non-Medical): Not on file  Physical Activity:    Days of Exercise per Week: Not on file   Minutes of Exercise per Session: Not on file  Stress:    Feeling of Stress : Not on file  Social Connections:     Frequency of Communication with Friends and Family: Not on file   Frequency of Social Gatherings with Friends and Family: Not on file   Attends Religious Services: Not on file   Active Member of Clubs or Organizations: Not on file   Attends Archivist Meetings: Not on file   Marital Status: Not on file  Intimate Partner Violence:    Fear of Current or Ex-Partner: Not on file   Emotionally Abused: Not on file   Physically Abused: Not on file   Sexually Abused: Not on file   Health Maintenance  Topic Date Due   PAP SMEAR-Modifier  02/12/2022   MAMMOGRAM  02/14/2022   COLONOSCOPY  02/10/2023   TETANUS/TDAP  10/08/2027   INFLUENZA VACCINE  Completed   COVID-19 Vaccine  Completed   Hepatitis C Screening  Completed   HIV Screening  Completed    The following portions of the patient's history were reviewed and updated as appropriate: allergies, current medications, past family history, past medical history, past social history, past surgical history and problem list.  Review of Systems A comprehensive review of systems was negative.  Objective:    BP (!) 143/55    Pulse (!) 51    Ht 5\' 6"  (1.676 m)    Wt 179 lb (81.2 kg)    LMP 11/11/2013    SpO2 100%    BMI 28.89 kg/m  General appearance: alert, cooperative and appears stated age Head: Normocephalic, without obvious abnormality, atraumatic Eyes: conj clear, EOMI, PEERLA Ears: normal TM's and external ear canals both ears Nose: Nares normal. Septum midline. Mucosa normal. No drainage or sinus tenderness. Throat: lips, mucosa, and tongue normal; teeth and gums normal Neck: no adenopathy, no carotid bruit, no JVD, supple, symmetrical, trachea midline and thyroid not enlarged, symmetric, no tenderness/mass/nodules Back: symmetric, no curvature. ROM normal. No CVA tenderness. Lungs: clear to auscultation bilaterally Heart: regular rate and rhythm, S1, S2 normal, no murmur, click, rub or gallop Abdomen:  soft, non-tender; bowel sounds normal; no masses,  no organomegaly Extremities: extremities normal, atraumatic, no cyanosis or edema Pulses: 2+ and symmetric Skin: Skin color, texture, turgor normal. No rashes or lesions Lymph nodes: Cervical adenopathy: nl and Supraclavicular adenopathy: nl Neurologic: Alert and oriented X 3, normal strength and tone. Normal symmetric reflexes. Normal coordination and gait    Assessment:    Healthy female exam.      Plan:     See After Visit Summary for Counseling Recommendations   Keep up a regular exercise program and make sure you are eating a healthy diet Try to eat 4 servings of dairy a day, or if you are lactose intolerant take a calcium with vitamin D daily.  Your vaccines are up to date.   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  Situational stress She had originally tried to taper off of the Celexa but did restart it.  She just felt like there are a lot of stressful things going on but would like to try at some point in the future to come back off of it.

## 2020-04-13 ENCOUNTER — Telehealth: Payer: Self-pay

## 2020-04-13 DIAGNOSIS — E7800 Pure hypercholesterolemia, unspecified: Secondary | ICD-10-CM

## 2020-04-13 MED ORDER — SIMVASTATIN 20 MG PO TABS: 20.0000 mg | ORAL_TABLET | Freq: Every day | ORAL | 3 refills | Status: DC

## 2020-04-13 NOTE — Telephone Encounter (Signed)
Pt called requesting on update regarding her cholesterol rx. Per pt, she is currently taking Livalo rx but insurance is not covering the medication. Pt states that provider was going to look into her history of past cholesterol medications and swapped out the Livalo rx. Pt is opened to whatever the provider recommends. Pls advise, thanks.

## 2020-04-13 NOTE — Telephone Encounter (Signed)
Task completed. Pt has been updated of simvastatin rx sent to local pharmacy. Pt informed to contact the clinic if she has any concerns taking the rx. No other inquiries during the call.

## 2020-04-13 NOTE — Telephone Encounter (Signed)
Yes, thank you for the reminder.  In looking back through her chart it looks like she has never tried simvastatin.  So work on a start with that one and have her take it nightly.  If she feels like it starting to cause any joint pain or muscle aches then okay to decrease to a half a tab nightly and see if that is more comfortable.

## 2020-04-22 ENCOUNTER — Other Ambulatory Visit: Payer: Self-pay | Admitting: Podiatry

## 2020-04-23 ENCOUNTER — Telehealth: Payer: Self-pay | Admitting: Family Medicine

## 2020-04-23 DIAGNOSIS — M25561 Pain in right knee: Secondary | ICD-10-CM

## 2020-04-23 DIAGNOSIS — M170 Bilateral primary osteoarthritis of knee: Secondary | ICD-10-CM

## 2020-04-23 DIAGNOSIS — M79671 Pain in right foot: Secondary | ICD-10-CM

## 2020-04-23 NOTE — Telephone Encounter (Signed)
Received fax for PA on Diclofenac Sodium 3% sent through cover my meds waiting on determination. - CF

## 2020-04-26 ENCOUNTER — Telehealth: Payer: Self-pay | Admitting: Neurology

## 2020-04-26 NOTE — Telephone Encounter (Signed)
Diclofenac gel PA form completed and faxed to [employer] at [phone] with confirmation received.

## 2020-05-10 ENCOUNTER — Telehealth: Payer: Self-pay

## 2020-05-10 DIAGNOSIS — E7800 Pure hypercholesterolemia, unspecified: Secondary | ICD-10-CM

## 2020-05-10 MED ORDER — DICLOFENAC SODIUM 1 % EX GEL: 4.0000 g | Freq: Four times a day (QID) | CUTANEOUS | 3 refills | Status: DC

## 2020-05-10 NOTE — Telephone Encounter (Signed)
Diclofenac PA was denied. I will place paperwork in basket.

## 2020-05-10 NOTE — Telephone Encounter (Signed)
Patient called and wanted to have blood work repeated after being on her Simvastatin. She was under the impression that she needed to recheck after 2 weeks, I told her it was usually closer to 6 weeks. Want to verify with you OK to order lipid panel and CMP for patient to have done after 6 weeks on med  Orders pended

## 2020-05-10 NOTE — Telephone Encounter (Signed)
Yes 4-6 weeks would be better. Labs signed.

## 2020-05-10 NOTE — Telephone Encounter (Signed)
Left pt msg advising of recommendations and repeat labs

## 2020-05-10 NOTE — Telephone Encounter (Signed)
New rx sent for 1% to see if covered

## 2020-05-11 ENCOUNTER — Other Ambulatory Visit: Payer: Self-pay | Admitting: *Deleted

## 2020-05-11 DIAGNOSIS — M170 Bilateral primary osteoarthritis of knee: Secondary | ICD-10-CM

## 2020-05-11 DIAGNOSIS — M79671 Pain in right foot: Secondary | ICD-10-CM

## 2020-05-11 DIAGNOSIS — M25561 Pain in right knee: Secondary | ICD-10-CM

## 2020-05-11 MED ORDER — DICLOFENAC SODIUM 1 % EX GEL: 4.0000 g | Freq: Four times a day (QID) | CUTANEOUS | 3 refills | Status: DC

## 2020-05-11 NOTE — Addendum Note (Signed)
Addended by: Narda Rutherford on: 05/11/2020 07:38 AM   Modules accepted: Orders

## 2020-05-19 LAB — LIPID PANEL W/REFLEX DIRECT LDL
Cholesterol: 166 mg/dL (ref <200)
HDL: 52 mg/dL (ref >=50)
LDL Cholesterol (Calc): 93 mg/dL (calc)
Non-HDL Cholesterol (Calc): 114 mg/dL (calc) (ref <130)
Total CHOL/HDL Ratio: 3.2 (calc) (ref <5.0)
Triglycerides: 118 mg/dL (ref <150)

## 2020-05-19 LAB — COMPLETE METABOLIC PANEL WITH GFR
AG Ratio: 2 (calc) (ref 1.0–2.5)
ALT: 16 U/L (ref 6–29)
AST: 18 U/L (ref 10–35)
Albumin: 4.2 g/dL (ref 3.6–5.1)
Alkaline phosphatase (APISO): 63 U/L (ref 37–153)
BUN: 14 mg/dL (ref 7–25)
CO2: 26 mmol/L (ref 20–32)
Calcium: 9.3 mg/dL (ref 8.6–10.4)
Chloride: 110 mmol/L (ref 98–110)
Creat: 0.68 mg/dL (ref 0.50–1.05)
GFR, Est African American: 113 mL/min/{1.73_m2} (ref >=60)
GFR, Est Non African American: 97 mL/min/{1.73_m2} (ref >=60)
Globulin: 2.1 g/dL (calc) (ref 1.9–3.7)
Glucose, Bld: 103 mg/dL — ABNORMAL HIGH (ref 65–99)
Potassium: 4.3 mmol/L (ref 3.5–5.3)
Sodium: 141 mmol/L (ref 135–146)
Total Bilirubin: 0.6 mg/dL (ref 0.2–1.2)
Total Protein: 6.3 g/dL (ref 6.1–8.1)

## 2020-05-25 ENCOUNTER — Ambulatory Visit (INDEPENDENT_AMBULATORY_CARE_PROVIDER_SITE_OTHER): Payer: 59 | Admitting: *Deleted

## 2020-05-25 ENCOUNTER — Other Ambulatory Visit: Payer: Self-pay

## 2020-05-25 DIAGNOSIS — M722 Plantar fascial fibromatosis: Secondary | ICD-10-CM | POA: Diagnosis not present

## 2020-05-25 DIAGNOSIS — B351 Tinea unguium: Secondary | ICD-10-CM

## 2020-05-25 NOTE — Patient Instructions (Signed)

## 2020-05-25 NOTE — Progress Notes (Addendum)
Patient presents for the 1st EPAT treatment today with complaint of plantar heel pain left. Diagnosed with plantar fasciitie by Dr. Jacqualyn Posey. This has been ongoing for several months. The patient has tried ice, stretching, NSAIDS and supportive shoe gear with no long term relief.   Most of the pain is located plantar heel and some arch.  ESWT administered and tolerated well.Treatment settings initiated at:   Energy: 15  Ended treatment session today with 3000 shocks at the following settings:   Energy: 15  Frequency: 6.0  Joules: 14.72   Reviewed post EPAT instructions. Advised to avoid ice and NSAIDs throughout the treatment process and to utilize boot or supportive shoes for at least the next 3 days.  Patient requested a boot and I did dispense one to her today.  Follow up for 2nd treatment in 1 week.

## 2020-06-01 ENCOUNTER — Ambulatory Visit (INDEPENDENT_AMBULATORY_CARE_PROVIDER_SITE_OTHER): Payer: 59 | Admitting: *Deleted

## 2020-06-01 ENCOUNTER — Other Ambulatory Visit: Payer: Self-pay

## 2020-06-01 DIAGNOSIS — B351 Tinea unguium: Secondary | ICD-10-CM

## 2020-06-01 DIAGNOSIS — M722 Plantar fascial fibromatosis: Secondary | ICD-10-CM

## 2020-06-01 NOTE — Progress Notes (Signed)
Patient presents for the 2nd EPAT treatment today with complaint of plantar heel pain left. Diagnosed with plantar fasciitie by Dr. Jacqualyn Posey. This has been ongoing for several months. The patient has tried ice, stretching, NSAIDS and supportive shoe gear with no long term relief.   Most of the pain is located plantar heel and some arch. She has noticed some improvement over the last week.  ESWT administered and tolerated well.Treatment settings initiated at:   Energy: 20  Ended treatment session today with 3000 shocks at the following settings:   Energy: 20  Frequency: 5.0  Joules: 19.62   Reviewed post EPAT instructions. Advised to avoid ice and NSAIDs throughout the treatment process and to utilize boot or supportive shoes for at least the next 3 days.  Patient did put on her walking boot after treatment today.  Follow up for 3rd treatment in 1 week.

## 2020-06-08 ENCOUNTER — Ambulatory Visit (INDEPENDENT_AMBULATORY_CARE_PROVIDER_SITE_OTHER): Payer: 59 | Admitting: *Deleted

## 2020-06-08 ENCOUNTER — Other Ambulatory Visit: Payer: Self-pay

## 2020-06-08 DIAGNOSIS — M722 Plantar fascial fibromatosis: Secondary | ICD-10-CM

## 2020-06-08 DIAGNOSIS — B351 Tinea unguium: Secondary | ICD-10-CM

## 2020-06-08 NOTE — Progress Notes (Signed)
Patient presents for the 3rd EPAT treatment today with complaint of plantar heel pain left. Diagnosed with plantar fasciitie by Dr. Jacqualyn Posey. This has been ongoing for several months. The patient has tried ice, stretching, NSAIDS and supportive shoe gear with no long term relief.   Most of the pain is located plantar and posterior heel and some arch. She has a little lump on the back of the heel that she said appears small.  ESWT administered and tolerated well.Treatment settings initiated at:   Energy: 25  Ended treatment session today with 3000 shocks at the following settings:   Energy: 25  Frequency: 4.0  Joules: 24.52   Reviewed post EPAT instructions. Advised to avoid ice and NSAIDs throughout the treatment process and to utilize boot or supportive shoes for at least the next 3 days.  Patient did put on her walking boot after treatment today.  Follow up for 4th treatment in 2 week.

## 2020-07-06 ENCOUNTER — Ambulatory Visit (INDEPENDENT_AMBULATORY_CARE_PROVIDER_SITE_OTHER): Payer: 59 | Admitting: *Deleted

## 2020-07-06 ENCOUNTER — Other Ambulatory Visit: Payer: Self-pay

## 2020-07-06 DIAGNOSIS — B351 Tinea unguium: Secondary | ICD-10-CM

## 2020-07-06 DIAGNOSIS — M722 Plantar fascial fibromatosis: Secondary | ICD-10-CM

## 2020-07-06 NOTE — Progress Notes (Signed)
Patient presents for the 4th EPAT treatment today with complaint of plantar heel pain left. Diagnosed with plantar fasciitie by Dr. Jacqualyn Posey. This has been ongoing for several months. The patient has tried ice, stretching, NSAIDS and supportive shoe gear with no long term relief.   Most of the pain today is located plantar heel. She says she is doing much better, she hasn't had nearly the pain she's had.   ESWT administered and tolerated well.Treatment settings initiated at:   Energy: 30  Ended treatment session today with 3000 shocks at the following settings:   Energy: 30  Frequency: 4.0  Joules: 29.43   Reviewed post EPAT instructions. Advised to avoid ice and NSAIDs throughout the treatment process and to utilize boot or supportive shoes for at least the next 3 days.  Patient did put on her walking boot after treatment today.  Follow up with Dr. Jacqualyn Posey in 4 weeks for re-evaluation.

## 2020-07-19 ENCOUNTER — Telehealth: Payer: Self-pay | Admitting: Neurology

## 2020-07-19 NOTE — Telephone Encounter (Signed)
Prior Authorization for Livalo submitted via covermymeds.   "Drug is covered by current benefit plan. No further PA activity needed"

## 2020-08-03 ENCOUNTER — Encounter: Payer: Self-pay | Admitting: Emergency Medicine

## 2020-08-03 ENCOUNTER — Emergency Department (INDEPENDENT_AMBULATORY_CARE_PROVIDER_SITE_OTHER)
Admission: EM | Admit: 2020-08-03 | Discharge: 2020-08-03 | Disposition: A | Discharge status: Home / Self Care | Payer: 59 | Source: Home / Self Care | Attending: Family Medicine | Admitting: Family Medicine

## 2020-08-03 ENCOUNTER — Other Ambulatory Visit: Payer: Self-pay

## 2020-08-03 DIAGNOSIS — J069 Acute upper respiratory infection, unspecified: Secondary | ICD-10-CM

## 2020-08-03 DIAGNOSIS — Z20822 Contact with and (suspected) exposure to covid-19: Secondary | ICD-10-CM

## 2020-08-03 NOTE — Discharge Instructions (Addendum)
Go home to rest Drink plenty of fluids Take Tylenol for pain or fever You may take over-the-counter cough and cold medicines as needed You must quarantine at home until your test result is available You can check for your test result in MyChart  Check the CDC website for isolation and quarantine guidelines

## 2020-08-03 NOTE — ED Triage Notes (Signed)
Exposure to Covid, Sore throat, congestion, headache x 2 days vaccinated

## 2020-08-04 NOTE — ED Provider Notes (Signed)
[place]    CSN: [id number] Arrival date & time: 08/03/20  1851      History   Chief Complaint No chief complaint on file.   HPI Tonya Banks is a 60 y.o. female.   HPI  Had direct exposure to COVID positive individual at work and now has symptoms.  Desires COVID testing.  Fully vaccinated.  Has sore throat and nasal congestion, headache since yesterday Has GERD and musculoskeletal complaints but otherwise healthy nonsmoker  History reviewed. No pertinent past medical history.  Patient Active Problem List   Diagnosis Date Noted  . Neuroma 08/16/2019  . Plantar fasciitis of left foot 08/16/2019  . Situational stress 08/08/2019  . Breast mass, right 12/08/2017  . Breast pain, right 12/08/2017  . Neuropathy 03/21/2016  . Left shoulder pain 10/04/2015  . RBBB 12/09/2011  . KNEE PAIN, RIGHT 03/12/2010  . BACK PAIN 12/12/2009  . IRRITABLE BOWEL SYNDROME 09/18/2008  . SPRAIN/STRAIN, NECK 08/19/2006  . HYPERCHOLESTEROLEMIA 05/05/2006  . GASTROESOPHAGEAL REFLUX, NO ESOPHAGITIS 05/05/2006    Past Surgical History:  Procedure Laterality Date  . APPENDECTOMY    . BREAST BIOPSY Left    stereo 2017  . breast lift  2011  . BREAST SURGERY    . REDUCTION MAMMAPLASTY Bilateral 1992  . TUBAL LIGATION    . tummy tuck    . uterine ablation  2000    OB History    Gravida  3   Para  3   Term  3   Preterm      AB      Living  3     SAB      IAB      Ectopic      Multiple      Live Births               Home Medications    Prior to Admission medications   Medication Sig Start Date End Date Taking? Authorizing Provider  calcium carbonate (OS-CAL) 600 MG TABS Take 600 mg by mouth 2 (two) times daily with a meal.      [provider]  Cholecalciferol (VITAMIN D PO) Take by mouth.    [provider]  citalopram (CELEXA) 20 MG tablet Take 1 tablet (20 mg total) by mouth daily. 04/12/20   Hali Marry, MD  Coenzyme  Q10 (CO Q 10 PO) Take 1 tablet by mouth daily.    [provider]  cyclobenzaprine (FLEXERIL) 10 MG tablet TAKE 1 TABLET 3 TIMES A DAY AS NEEDED SPASMS 01/03/20   Hali Marry, MD  diclofenac Sodium (VOLTAREN) 1 % GEL Apply 4 g topically 4 (four) times daily. 05/11/20   Hali Marry, MD  fish oil-omega-3 fatty acids 1000 MG capsule Take 4 g by mouth daily.      [provider]  gabapentin (NEURONTIN) 100 MG capsule TAKE 1-3 CAPSULES (100-300 MG TOTAL) BY MOUTH AT BEDTIME. 02/08/20   Hali Marry, MD  lansoprazole (PREVACID) 30 MG capsule Take 1 capsule (30 mg total) by mouth 2 (two) times daily before a meal. 12/17/18   Hali Marry, MD  meloxicam (MOBIC) 15 MG tablet TAKE 1 TABLET BY MOUTH EVERY DAY 04/22/20   Trula Slade, DPM  metaxalone (SKELAXIN) 800 MG tablet Take 1 tablet (800 mg total) by mouth 2 (two) times daily as needed for up to 1 dose for muscle spasms. 12/23/19   Hali Marry, MD  POTASSIUM PO  Take by mouth.    [provider]  simvastatin (ZOCOR) 20 MG tablet Take 1 tablet (20 mg total) by mouth at bedtime. 04/13/20   Hali Marry, MD  Turmeric 500 MG TABS Take 1,000 mg by mouth daily.    [provider]  valACYclovir (VALTREX) 1000 MG tablet Take orally BID for a week prn 04/01/19   Clovia Cuff C, MD  vitamin C (ASCORBIC ACID) 500 MG tablet Take 500 mg by mouth daily.    [provider]    Family History Family History  Problem Relation Age of Onset  . Cancer Father        mesothelioma  . Gout Father   . Hypertension Mother     Social History Social History   Tobacco Use  . Smoking status: Never Smoker  . Smokeless tobacco: Never Used  Vaping Use  . Vaping Use: Never used  Substance Use Topics  . Alcohol use: Yes    Alcohol/week: 0.0 - 1.0 standard drinks    Comment: month  . Drug use: No     Allergies   Dexilant [dexlansoprazole], Meloxicam, Pravastatin, and  Sulfonamide derivatives   Review of Systems Review of Systems See HPI Physical Exam Triage Vital Signs ED Triage Vitals  Enc Vitals Group     BP 08/03/20 2015 (!) 156/74     Pulse Rate 08/03/20 2015 60     Resp 08/03/20 2015 18     Temp 08/03/20 2015 98.8 F (37.1 C)     Temp Source 08/03/20 2015 Oral     SpO2 08/03/20 2015 99 %     Weight 08/03/20 2016 180 lb (81.6 kg)     Height 08/03/20 2016 5\' 6"  (1.676 m)     Head Circumference --      Peak Flow --      Pain Score 08/03/20 2016 0     Pain Loc --      Pain Edu? --      Excl. in [place]? --    No data found.  Updated Vital Signs BP (!) 156/74 (BP Location: Right Arm)   Pulse 60   Temp 98.8 F (37.1 C) (Oral)   Resp 18   Ht 5\' 6"  (1.676 m)   Wt 81.6 kg   LMP 11/11/2013   SpO2 99%   BMI 29.05 kg/m   Visual Acuity Right Eye Distance:   Left Eye Distance:   Bilateral Distance:    Right Eye Near:   Left Eye Near:    Bilateral Near:     Physical Exam Constitutional:      General: She is not in acute distress.    Appearance: She is well-developed and well-nourished.  HENT:     Head: Normocephalic and atraumatic.     Nose: Congestion present.     Mouth/Throat:     Pharynx: Posterior oropharyngeal erythema present.  Eyes:     Conjunctiva/sclera: Conjunctivae normal.     Pupils: Pupils are equal, round, and reactive to light.  Cardiovascular:     Rate and Rhythm: Normal rate.     Heart sounds: Normal heart sounds.  Pulmonary:     Effort: Pulmonary effort is normal. No respiratory distress.     Breath sounds: Normal breath sounds.  Abdominal:     General: There is no distension.     Palpations: Abdomen is soft.  Musculoskeletal:        General: No edema. Normal range of motion.  Cervical back: Normal range of motion.  Skin:    General: Skin is warm and dry.  Neurological:     General: No focal deficit present.     Mental Status: She is alert.  Psychiatric:        Behavior: Behavior normal.       UC Treatments / Results  Labs (all labs ordered are listed, but only abnormal results are displayed) Labs Reviewed  COVID-19, FLU A+B NAA    EKG   Radiology No results found.  Procedures Procedures (including critical care time)  Medications Ordered in UC Medications - No data to display  Initial Impression / Assessment and Plan / UC Course  I have reviewed the triage vital signs and the nursing notes.  Pertinent labs & imaging results that were available during my care of the patient were reviewed by me and considered in my medical decision making (see chart for details).    Reviewed current covid guidelines for quarantine and isolation Final Clinical Impressions(s) / UC Diagnoses   Final diagnoses:  Close exposure to COVID-19 virus  Viral URI     Discharge Instructions     Go home to rest Drink plenty of fluids Take Tylenol for pain or fever You may take over-the-counter cough and cold medicines as needed You must quarantine at home until your test result is available You can check for your test result in MyChart  Check the CDC website for isolation and quarantine guidelines    ED Prescriptions    None     PDMP not reviewed this encounter.   Raylene Everts, MD 08/04/20 1236

## 2020-08-07 LAB — COVID-19, FLU A+B NAA
Influenza A, NAA: NOT DETECTED
Influenza B, NAA: NOT DETECTED
SARS-CoV-2, NAA: NOT DETECTED

## 2020-10-10 ENCOUNTER — Other Ambulatory Visit: Payer: Self-pay | Admitting: Family Medicine

## 2020-10-10 DIAGNOSIS — F439 Reaction to severe stress, unspecified: Secondary | ICD-10-CM

## 2020-10-10 LAB — LIPID PANEL
Cholesterol: 163 (ref 0–200)
HDL: 62 (ref 35–70)
LDL Cholesterol: 83
Triglycerides: 88 (ref 40–160)

## 2020-10-10 LAB — BASIC METABOLIC PANEL: Glucose: 106

## 2020-10-11 NOTE — Telephone Encounter (Signed)
Patient said due to being short staffed, she has to check with her manager and see which dates work best so she will call back when she figures this out. AM

## 2020-10-11 NOTE — Telephone Encounter (Signed)
Please call pt and schedule her for f/u on celexa.

## 2020-10-11 NOTE — Telephone Encounter (Signed)
By chance did you offer her a virtual? Maybe something before/after lunch?

## 2020-10-11 NOTE — Telephone Encounter (Signed)
Called pt to see if we could offer her a virtual and couldn't contact her. LVM to call and schedule an virtual. tvt

## 2020-11-01 ENCOUNTER — Ambulatory Visit: Payer: 59 | Admitting: Family Medicine

## 2020-11-01 ENCOUNTER — Encounter: Payer: Self-pay | Admitting: Family Medicine

## 2020-11-01 ENCOUNTER — Other Ambulatory Visit: Payer: Self-pay

## 2020-11-01 VITALS — BP 120/52 | HR 57 | Ht 66.0 in | Wt 189.0 lb

## 2020-11-01 DIAGNOSIS — F439 Reaction to severe stress, unspecified: Secondary | ICD-10-CM

## 2020-11-01 DIAGNOSIS — R7309 Other abnormal glucose: Secondary | ICD-10-CM | POA: Diagnosis not present

## 2020-11-01 DIAGNOSIS — M722 Plantar fascial fibromatosis: Secondary | ICD-10-CM

## 2020-11-01 DIAGNOSIS — E7800 Pure hypercholesterolemia, unspecified: Secondary | ICD-10-CM

## 2020-11-01 DIAGNOSIS — G629 Polyneuropathy, unspecified: Secondary | ICD-10-CM

## 2020-11-01 DIAGNOSIS — S139XXD Sprain of joints and ligaments of unspecified parts of neck, subsequent encounter: Secondary | ICD-10-CM

## 2020-11-01 LAB — POCT GLYCOSYLATED HEMOGLOBIN (HGB A1C): Hemoglobin A1C: 5.5 % (ref 4.0–5.6)

## 2020-11-01 MED ORDER — CITALOPRAM HYDROBROMIDE 20 MG PO TABS: 20.0000 mg | ORAL_TABLET | Freq: Every day | ORAL | 1 refills | Status: DC

## 2020-11-01 MED ORDER — CYCLOBENZAPRINE HCL 10 MG PO TABS: 10.0000 mg | ORAL_TABLET | Freq: Every evening | ORAL | 0 refills | Status: DC | PRN

## 2020-11-01 MED ORDER — METAXALONE 800 MG PO TABS: 800.0000 mg | ORAL_TABLET | Freq: Two times a day (BID) | ORAL | 0 refills | Status: DC | PRN

## 2020-11-01 NOTE — Assessment & Plan Note (Signed)
Far she has actually done really well on her current statin.  She did bring in recent lipids.

## 2020-11-01 NOTE — Assessment & Plan Note (Signed)
Stable on current regimen.  F/U in 6 months.

## 2020-11-01 NOTE — Progress Notes (Signed)
Established Patient Office Visit  Subjective:  Patient ID: Tonya Banks, female    DOB: 1961/04/07  Age: 60 y.o. MRN: [id number]  CC:  Chief Complaint  Patient presents with  . Follow-up    Medication refill    HPI HARMAN FERRIN presents for   F/U situational stress.  Work has been particularly stressful recently a coworker has been out for about 6 weeks but just came back and this is been much better since he has been back she is been working a lot of overtime and not been able to do a lot of self-care like exercising.  He is happy with her citalopram and would like to continue the medication she feels like it does make a really big difference in her symptoms.  She would like a refill on her muscle relaxer she is requesting 90-day supply because it is much cheaper for her.  Hyperlipidemia - tolerating stating well with no myalgias or significant side effects.  Lab Results  Component Value Date   CHOL 163 10/10/2020   HDL 62 10/10/2020   LDLCALC 83 10/10/2020   TRIG 88 10/10/2020   CHOLHDL 3.2 05/18/2020      Still having problems with her plantar fasciitis and says she will probably end up having the surgery next year.  She has been working with podiatrist to try to get it better.  History reviewed. No pertinent past medical history.  Past Surgical History:  Procedure Laterality Date  . APPENDECTOMY    . BREAST BIOPSY Left    stereo 2017  . breast lift  2011  . BREAST SURGERY    . REDUCTION MAMMAPLASTY Bilateral 1992  . TUBAL LIGATION    . tummy tuck    . uterine ablation  2000    Family History  Problem Relation Age of Onset  . Cancer Father        mesothelioma  . Gout Father   . Hypertension Mother     Social History   Socioeconomic History  . Marital status: Married    Spouse name: Not on file  . Number of children: 3  . Years of education: Not on file  . Highest education level: Not on file  Occupational History  . Not on file  Tobacco  Use  . Smoking status: Never Smoker  . Smokeless tobacco: Never Used  Vaping Use  . Vaping Use: Never used  Substance and Sexual Activity  . Alcohol use: Yes    Alcohol/week: 0.0 - 1.0 standard drinks    Comment: month  . Drug use: No  . Sexual activity: Yes    Partners: Male    Birth control/protection: Post-menopausal  Other Topics Concern  . Not on file  Social History Narrative   No regular exercise.     Social Determinants of Health   Financial Resource Strain: Not on file  Food Insecurity: Not on file  Transportation Needs: Not on file  Physical Activity: Not on file  Stress: Not on file  Social Connections: Not on file  Intimate Partner Violence: Not on file    Outpatient Medications Prior to Visit  Medication Sig Dispense Refill  . calcium carbonate (OS-CAL) 600 MG TABS Take 600 mg by mouth 2 (two) times daily with a meal.    . Cholecalciferol (VITAMIN D PO) Take by mouth.    . Coenzyme Q10 (CO Q 10 PO) Take 1 tablet by mouth daily.    . diclofenac Sodium (VOLTAREN) 1 % GEL  Apply 4 g topically 4 (four) times daily. 400 g 3  . fish oil-omega-3 fatty acids 1000 MG capsule Take 4 g by mouth daily.    Marland Kitchen gabapentin (NEURONTIN) 100 MG capsule TAKE 1-3 CAPSULES (100-300 MG TOTAL) BY MOUTH AT BEDTIME. 270 capsule 3  . lansoprazole (PREVACID) 30 MG capsule Take 1 capsule (30 mg total) by mouth 2 (two) times daily before a meal. 60 capsule 1  . POTASSIUM PO Take by mouth.    . simvastatin (ZOCOR) 20 MG tablet Take 1 tablet (20 mg total) by mouth at bedtime. 90 tablet 3  . Turmeric 500 MG TABS Take 1,000 mg by mouth daily.    . valACYclovir (VALTREX) 1000 MG tablet Take orally BID for a week prn 14 tablet 6  . vitamin C (ASCORBIC ACID) 500 MG tablet Take 500 mg by mouth daily.    . citalopram (CELEXA) 20 MG tablet TAKE 1 TABLET BY MOUTH EVERY DAY 30 tablet 0  . cyclobenzaprine (FLEXERIL) 10 MG tablet TAKE 1 TABLET 3 TIMES A DAY AS NEEDED SPASMS 30 tablet 0  . meloxicam  (MOBIC) 15 MG tablet TAKE 1 TABLET BY MOUTH EVERY DAY 30 tablet 0  . metaxalone (SKELAXIN) 800 MG tablet Take 1 tablet (800 mg total) by mouth 2 (two) times daily as needed for up to 1 dose for muscle spasms. 20 tablet 0   No facility-administered medications prior to visit.    Allergies  Allergen Reactions  . Dexilant [Dexlansoprazole]     Sharp pains on stomach  . Meloxicam     Pt stated, "This upsets my stomach"  . Pravastatin Other (See Comments)    msucle aches   . Sulfonamide Derivatives Other (See Comments)    Stomach pain.     ROS Review of Systems    Objective:    Physical Exam Constitutional:      Appearance: She is well-developed.  HENT:     Head: Normocephalic and atraumatic.  Cardiovascular:     Rate and Rhythm: Normal rate and regular rhythm.     Heart sounds: Normal heart sounds.  Pulmonary:     Effort: Pulmonary effort is normal.     Breath sounds: Normal breath sounds.  Skin:    General: Skin is warm and dry.  Neurological:     Mental Status: She is alert and oriented to person, place, and time.  Psychiatric:        Behavior: Behavior normal.     BP (!) 120/52   Pulse (!) 57   Ht 5\' 6"  (1.676 m)   Wt 189 lb (85.7 kg)   LMP 11/11/2013   SpO2 100%   BMI 30.51 kg/m  Wt Readings from Last 3 Encounters:  11/01/20 189 lb (85.7 kg)  08/03/20 180 lb (81.6 kg)  04/12/20 179 lb (81.2 kg)     Health Maintenance Due  Topic Date Due  . COVID-19 Vaccine (3 - Booster for Pfizer series) 04/26/2020    There are no preventive care reminders to display for this patient.  Lab Results  Component Value Date   TSH 1.26 12/17/2018   Lab Results  Component Value Date   WBC CANCELED 12/17/2018   HGB 13.0 03/04/2018   HCT 38.6 03/04/2018   MCV 90.2 03/04/2018   PLT 218 03/04/2018   Lab Results  Component Value Date   NA 141 05/18/2020   K 4.3 05/18/2020   CO2 26 05/18/2020   GLUCOSE 103 (H) 05/18/2020   BUN 14  05/18/2020   CREATININE 0.68  05/18/2020   BILITOT 0.6 05/18/2020   ALKPHOS 91 02/12/2017   AST 18 05/18/2020   ALT 16 05/18/2020   PROT 6.3 05/18/2020   ALBUMIN 4.6 02/12/2017   CALCIUM 9.3 05/18/2020   Lab Results  Component Value Date   CHOL 163 10/10/2020   Lab Results  Component Value Date   HDL 62 10/10/2020   Lab Results  Component Value Date   LDLCALC 83 10/10/2020   Lab Results  Component Value Date   TRIG 88 10/10/2020   Lab Results  Component Value Date   CHOLHDL 3.2 05/18/2020   Lab Results  Component Value Date   HGBA1C 5.5 11/01/2020      Assessment & Plan:   Problem List Items Addressed This Visit      Nervous and Auditory   Neuropathy     Musculoskeletal and Integument   SPRAIN/STRAIN, NECK    Refill her muscle relaxer but continue to use sparingly please.      Plantar fasciitis of left foot    Unfortunately has not responded to conservative treatment and will likely end up getting surgery.        Other   Situational stress - Primary    Stable on current regimen.  F/U in 6 months.        Relevant Medications   citalopram (CELEXA) 20 MG tablet   HYPERCHOLESTEROLEMIA    Far she has actually done really well on her current statin.  She did bring in recent lipids.       Other Visit Diagnoses    Abnormal glucose       Relevant Orders   POCT glycosylated hemoglobin (Hb A1C) (Completed)     Lab Results  Component Value Date   HGBA1C 5.5 11/01/2020   Abnormal glucose-A1c looks great today no sign of diabetes or prediabetes.  Meds ordered this encounter  Medications  . citalopram (CELEXA) 20 MG tablet    Sig: Take 1 tablet (20 mg total) by mouth daily.    Dispense:  90 tablet    Refill:  1  . metaxalone (SKELAXIN) 800 MG tablet    Sig: Take 1 tablet (800 mg total) by mouth 2 (two) times daily as needed for muscle spasms.    Dispense:  60 tablet    Refill:  0  . cyclobenzaprine (FLEXERIL) 10 MG tablet    Sig: Take 1 tablet (10 mg total) by mouth at  bedtime as needed for muscle spasms.    Dispense:  90 tablet    Refill:  0    Follow-up: Return in about 6 months (around 05/03/2021) for stress medication and  neuropathy .    Beatrice Lecher, MD

## 2020-11-02 ENCOUNTER — Other Ambulatory Visit: Payer: Self-pay | Admitting: Podiatry

## 2020-11-02 ENCOUNTER — Encounter: Payer: Self-pay | Admitting: Family Medicine

## 2020-11-13 NOTE — Assessment & Plan Note (Signed)
Unfortunately has not responded to conservative treatment and will likely end up getting surgery.

## 2020-11-13 NOTE — Assessment & Plan Note (Signed)
Refill her muscle relaxer but continue to use sparingly please.

## 2020-11-29 ENCOUNTER — Other Ambulatory Visit: Payer: Self-pay

## 2020-11-29 ENCOUNTER — Ambulatory Visit: Payer: 59 | Admitting: Podiatry

## 2020-11-29 DIAGNOSIS — B351 Tinea unguium: Secondary | ICD-10-CM | POA: Diagnosis not present

## 2020-11-29 DIAGNOSIS — M722 Plantar fascial fibromatosis: Secondary | ICD-10-CM | POA: Diagnosis not present

## 2020-12-04 NOTE — Progress Notes (Signed)
Subjective: 59 year old female presents the office today for her right big toenail becoming thickened discolored.  Also she is continue to have chronic foot pain on the left side.  She is attempted numerous conservative treatments including injection, shoe changes, offloading, orthotics, EPAT without significant resolution. Denies any systemic complaints such as fevers, chills, nausea, vomiting. No acute changes since last appointment, and no other complaints at this time.   Objective: AAO x3, NAD DP/PT pulses palpable bilaterally, CRT less than 3 seconds The right hallux toenails hypertrophic and dystrophic with yellow-brown discoloration.  No significant pain today there is no edema, erythema or signs of infection. Left side there is tenderness palpation still along the plantar medial tubercle of the calcaneus at insertion of plantar fascia.  The plantar fascial appears to be intact clinically.  No pain with lateral compression of calcaneus.  No edema, erythema.  No pain the Achilles tendon.  Achilles tendon appears to be intact.  MMT 5/5. 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  -Regards the right hallux at debrided the nails and this for culture, pathology to [place] to chronic foot pain on left side we will order an MRI to rule out partial to the plantar fashion this is for potential surgical intervention. -Patient encouraged to call the office with any questions, concerns, change in symptoms.   Trula Slade DPM

## 2020-12-07 ENCOUNTER — Telehealth: Payer: Self-pay | Admitting: *Deleted

## 2020-12-07 ENCOUNTER — Telehealth: Payer: Self-pay | Admitting: Podiatry

## 2020-12-07 ENCOUNTER — Other Ambulatory Visit: Payer: Self-pay | Admitting: Podiatry

## 2020-12-07 DIAGNOSIS — M722 Plantar fascial fibromatosis: Secondary | ICD-10-CM

## 2020-12-07 NOTE — Telephone Encounter (Signed)
Lattie Haw- I have put a new order in for [facility]. Can you please follow up on this and let the patient know? Thanks.

## 2020-12-07 NOTE — Telephone Encounter (Signed)
Patient called inquiring about MRI scheduling. [facility] told patient they couldn't get her scheduled anywhere in [place] and patient has requested the order be changed for a location in [place] for convenience, Please Advise

## 2020-12-07 NOTE — Telephone Encounter (Signed)
Called and spoke with the patient and stated that we have a new order for mri at [facility] and I spoke with the insurance company and had to fax chart notes and should hear something within 24 to 72 hours and sometimes it can take up to 15 days.Lattie Haw

## 2020-12-07 NOTE — Telephone Encounter (Signed)
New order placed for [facility] in [place]. Lattie Haw- can you please follow up on this? Thanks.

## 2020-12-07 NOTE — Telephone Encounter (Signed)
Patient is still awaiting call from imaging to schedule MRI. Th order is ready for scheduling.

## 2020-12-07 NOTE — Telephone Encounter (Signed)
Called [facility] and spoke with rebecca o and had to send chart notes to [phone] and I did that today. Tonya Banks

## 2020-12-11 ENCOUNTER — Telehealth: Payer: Self-pay | Admitting: *Deleted

## 2020-12-11 NOTE — Telephone Encounter (Signed)
Patient is calling to answer physician's message thru Mychart but was unable to respond but she is wanting to try the topical application only. Please send prescription to pharmacy listed on file.Please advise.

## 2020-12-11 NOTE — Telephone Encounter (Signed)
The case number is [id number] and I refaxed again due to the first time was faxed from [facility] office on Friday 12-07-2020. Lattie Haw

## 2020-12-12 ENCOUNTER — Other Ambulatory Visit: Payer: Self-pay | Admitting: Podiatry

## 2020-12-12 MED ORDER — EFINACONAZOLE 10 % EX SOLN: 1.0000 [drp] | Freq: Every day | CUTANEOUS | 11 refills | Status: DC

## 2020-12-12 NOTE — Progress Notes (Signed)
jublia ordered

## 2020-12-12 NOTE — Telephone Encounter (Signed)
Please let her know that I ordered Jublia. It is usually cheaper if I get it from a pharmacy in [place] (your rx pharmacy) so I sent it there. They will let her know the coverage and mail it to her. If she does not get it for whatever reason, let me know and we can try the topical through [place]. Thanks!

## 2020-12-13 NOTE — Telephone Encounter (Signed)
Returned call to patient, no answer,left vmessage per Dr Leigh Aurora note that he had ordered Jublia.

## 2020-12-18 ENCOUNTER — Other Ambulatory Visit: Payer: Self-pay | Admitting: Podiatry

## 2020-12-18 DIAGNOSIS — M722 Plantar fascial fibromatosis: Secondary | ICD-10-CM

## 2020-12-18 NOTE — Telephone Encounter (Signed)
Called and spoke with Bonnita Nasuti at [facility] today and the patient will be getting a call from them shortly to set up an appointment for an ultrasound. Lattie Haw

## 2020-12-18 NOTE — Progress Notes (Signed)
MRI denied, ordered ultrasound

## 2020-12-20 ENCOUNTER — Other Ambulatory Visit: Payer: Self-pay

## 2020-12-20 DIAGNOSIS — M722 Plantar fascial fibromatosis: Secondary | ICD-10-CM

## 2020-12-20 NOTE — Progress Notes (Signed)
UL

## 2020-12-21 ENCOUNTER — Other Ambulatory Visit: Payer: Self-pay | Admitting: Podiatry

## 2020-12-21 DIAGNOSIS — M722 Plantar fascial fibromatosis: Secondary | ICD-10-CM

## 2020-12-26 ENCOUNTER — Other Ambulatory Visit: Payer: 59

## 2020-12-28 ENCOUNTER — Other Ambulatory Visit: Payer: Self-pay | Admitting: Podiatry

## 2020-12-28 ENCOUNTER — Ambulatory Visit
Admission: RE | Admit: 2020-12-28 | Discharge: 2020-12-28 | Disposition: A | Discharge status: Home / Self Care | Payer: 59 | Source: Ambulatory Visit | Attending: Podiatry | Admitting: Podiatry

## 2020-12-28 DIAGNOSIS — M722 Plantar fascial fibromatosis: Secondary | ICD-10-CM

## 2021-01-01 ENCOUNTER — Encounter: Payer: Self-pay | Admitting: Podiatry

## 2021-01-11 ENCOUNTER — Ambulatory Visit: Payer: 59 | Admitting: Podiatry

## 2021-01-11 ENCOUNTER — Other Ambulatory Visit: Payer: Self-pay

## 2021-01-11 DIAGNOSIS — M216X2 Other acquired deformities of left foot: Secondary | ICD-10-CM | POA: Diagnosis not present

## 2021-01-11 DIAGNOSIS — M722 Plantar fascial fibromatosis: Secondary | ICD-10-CM

## 2021-01-11 NOTE — Patient Instructions (Signed)

## 2021-01-15 NOTE — Progress Notes (Signed)
Subjective: 60 year old female presents the office today for surgical consultation.  Ongoing chronic pain to her left foot.  She is attempted numerous conservative treatments without any significant resolution she was to proceed with surgery at this point.  She states she has pain if she gets up and tries to be on her feet at all and causes discomfort.  The only relief she gets that she sits down and is not active. Denies any systemic complaints such as fevers, chills, nausea, vomiting. No acute changes since last appointment, and no other complaints at this time.   Objective: AAO x3, NAD DP/PT pulses palpable bilaterally, CRT less than 3 seconds There is tenderness palpation on plantar aspect calcaneus at the insertion of plantar fascia on the left side.  The plantar fascial appears to be intact.  There is no pain with lateral compression of calcaneus.  Equinus is present as well.  MMT 5/5. No pain with calf compression, swelling, warmth, erythema  Assessment: Plantar fasciitis, gastroc equinus  Plan: -All treatment options discussed with the patient including all alternatives, risks, complications.  -Reviewed the ultrasound with her today.  Discussed with conservative possible surgical treatment options.  This time as well as proceed with surgical intervention.  After answering her questions we decided to proceed with left foot endoscopic plantar fascial release, gastrocnemius recession as well as PRP injection. -The incision placement as well as the postoperative course was discussed with the patient. I discussed risks of the surgery which include, but not limited to, infection, bleeding, pain, swelling, need for further surgery, delayed or nonhealing, painful or ugly scar, numbness or sensation changes, over/under correction, recurrence, transfer lesions, further deformity, tendon tear, DVT/PE, loss of toe/foot. Patient understands these risks and wishes to proceed with surgery. The surgical  consent was reviewed with the patient all 3 pages were signed. No promises or guarantees were given to the outcome of the procedure. All questions were answered to the best of my ability. Before the surgery the patient was encouraged to call the office if there is any further questions. The surgery will be performed at the [facility] on an outpatient basis. -Patient encouraged to call the office with any questions, concerns, change in symptoms.   30 minutes were spent with the patient and greater than 50% of time was in face-to-face time and the rest was coordinating care and scheduling surgery.  Trula Slade DPM

## 2021-01-18 DIAGNOSIS — M79676 Pain in unspecified toe(s): Secondary | ICD-10-CM

## 2021-01-19 IMAGING — DX CHEST - 2 VIEW
2 series · 2 of 2 positions shown · non-contrast
Comparison: 10/25/2013

CLINICAL DATA: Epigastric pain, jaw pain, dizziness, feeling unwell

EXAM:
CHEST - 2 VIEW

[chest pa]
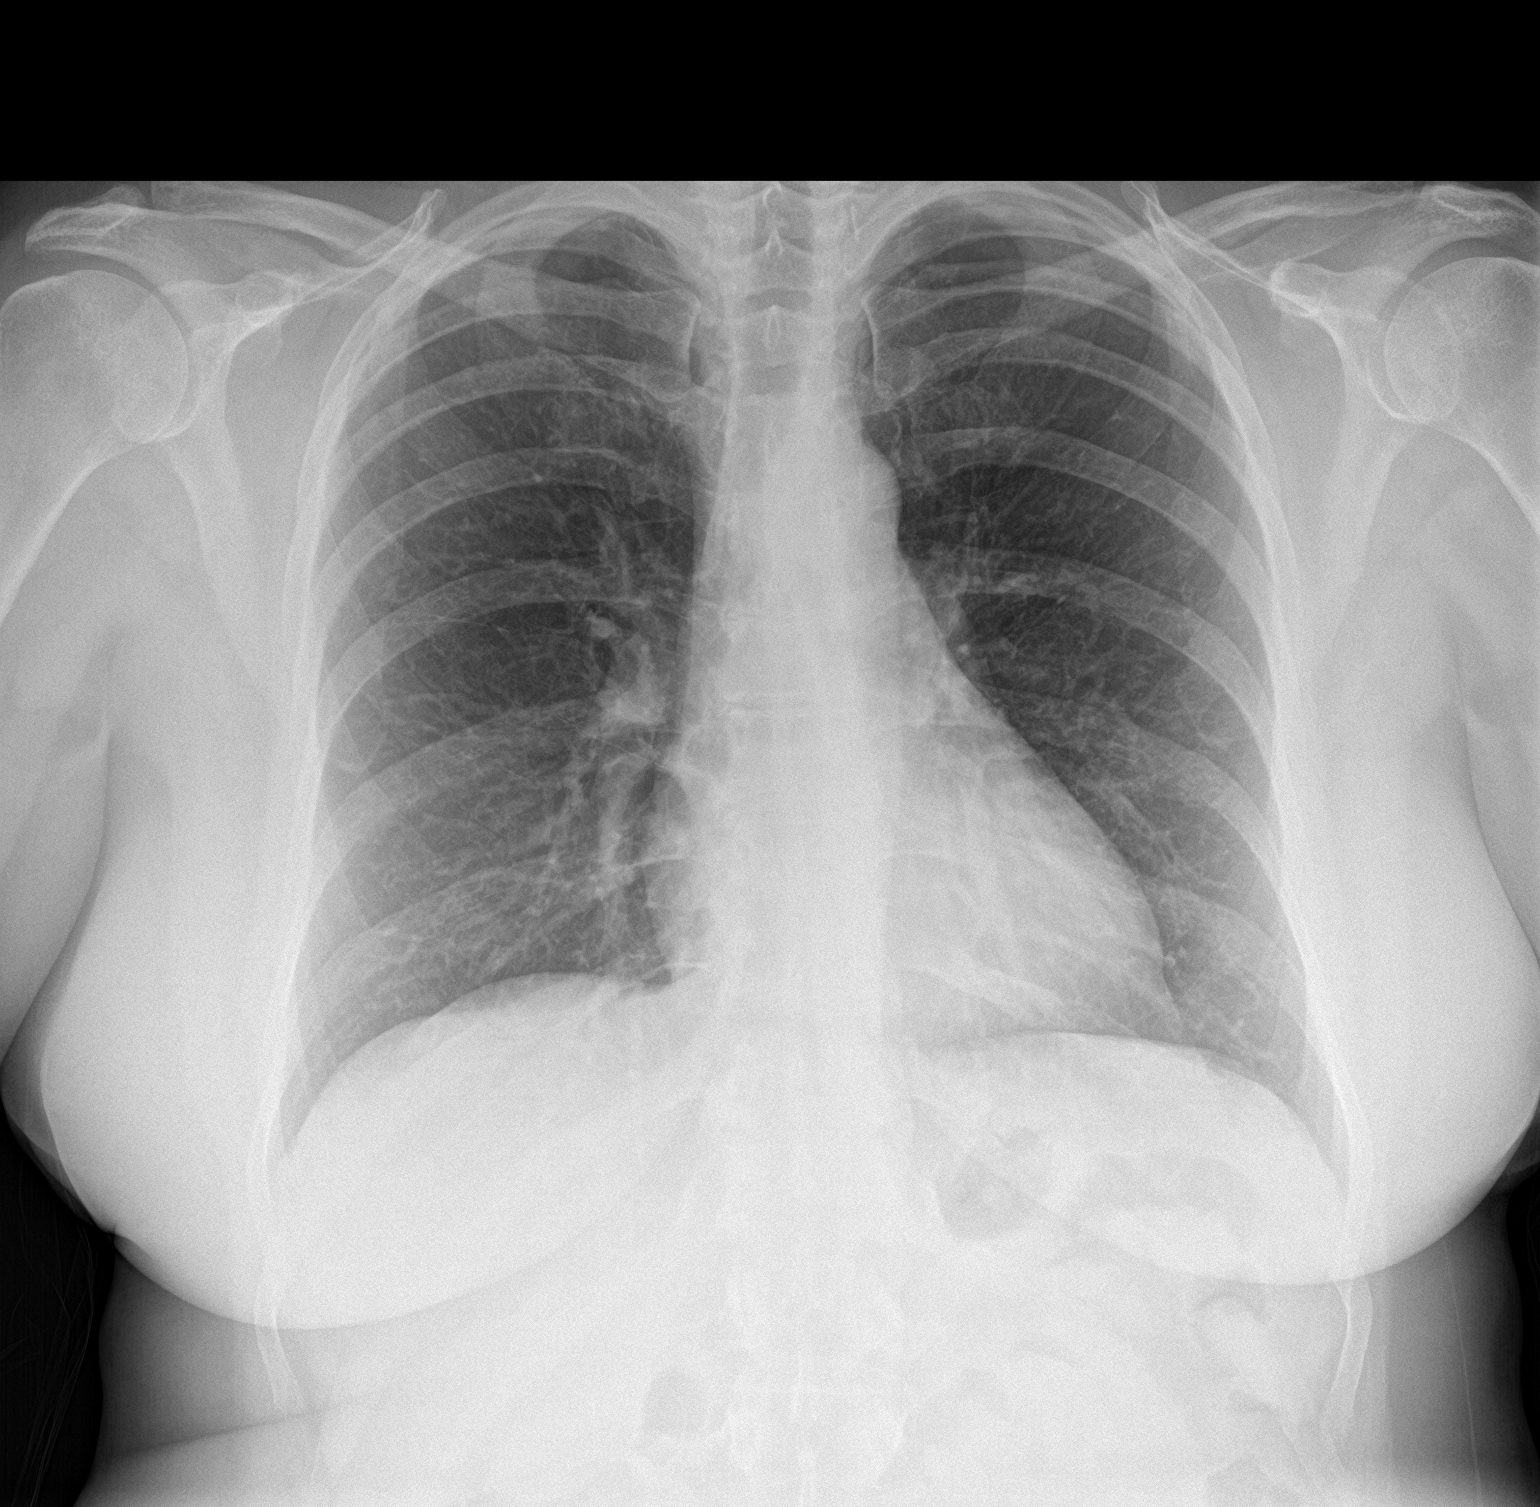

[chest lat]
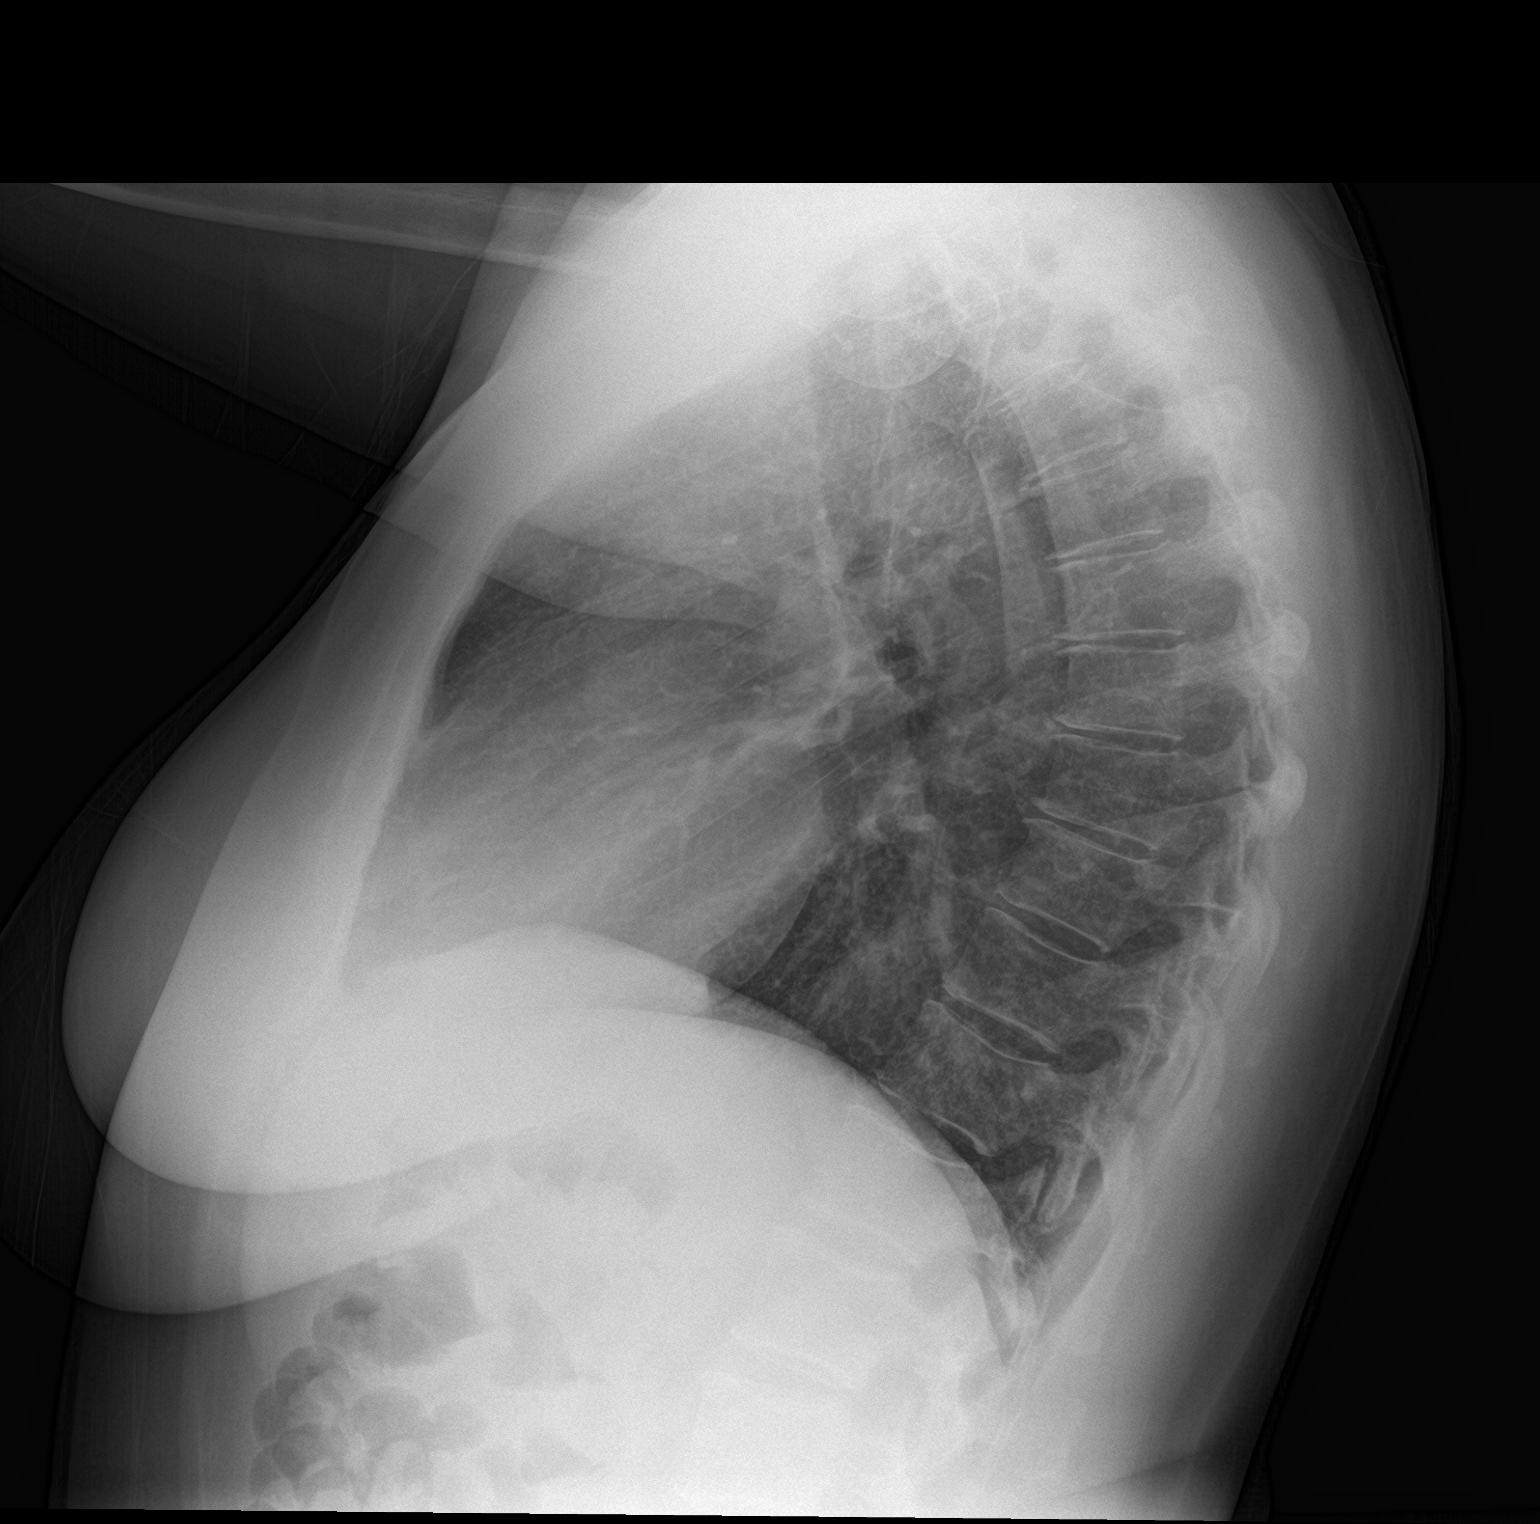

[2 of 2 positions shown; findings below may reference images not displayed]

FINDINGS: The heart size and mediastinal contours are within normal limits.
Both lungs are clear. The visualized skeletal structures are
unremarkable.
IMPRESSION: No acute abnormality of the lungs.

## 2021-01-21 ENCOUNTER — Encounter: Payer: Self-pay | Admitting: Family Medicine

## 2021-01-22 ENCOUNTER — Other Ambulatory Visit: Payer: Self-pay | Admitting: Family Medicine

## 2021-01-22 DIAGNOSIS — Z1231 Encounter for screening mammogram for malignant neoplasm of breast: Secondary | ICD-10-CM

## 2021-01-22 MED ORDER — VALACYCLOVIR HCL 1 G PO TABS: ORAL_TABLET | ORAL | 6 refills | Status: DC

## 2021-02-05 ENCOUNTER — Telehealth: Payer: Self-pay

## 2021-02-05 ENCOUNTER — Other Ambulatory Visit: Payer: Self-pay | Admitting: Podiatry

## 2021-02-05 DIAGNOSIS — M722 Plantar fascial fibromatosis: Secondary | ICD-10-CM

## 2021-02-05 NOTE — Telephone Encounter (Addendum)
Tonya Banks is having surgery on 02/27/2021 and is requesting a knee scooter. If this is okay, will you add the order and I will notify [place]. Thanks

## 2021-02-05 NOTE — Progress Notes (Signed)
dme

## 2021-02-09 ENCOUNTER — Other Ambulatory Visit: Payer: Self-pay | Admitting: Family Medicine

## 2021-02-19 ENCOUNTER — Telehealth: Payer: Self-pay | Admitting: Urology

## 2021-02-19 NOTE — Telephone Encounter (Signed)
PT called wanting me to pass a message to Dr. Jacqualyn Posey. She stated that her insurance will not cover the PRP that she will be getting during her sx on 8/3 but pt stats she still wants it to be done she believes it will help. She stated she will pay for it. For Dr. Jacqualyn Posey to still do the PRP on 8/3 during her sx.

## 2021-02-19 NOTE — Telephone Encounter (Signed)
DOS - 02/27/21  EPF LEFT --- [id number] GASTROCNEMIUS RECESS LEFT --- [id number] PRP LEFT --- 0232T   [employer] EFFECTIVE DATE - 07/28/88   SPOKE [place] AND SHE STATED THAT CPT CODES 23557 AND 32202 NO PRIOR AUTH IS REQUIRED. CPT CODE 0232T WAS DENIED.  REF # GAIL  02/01/21 '@2'$ :46PM  PENDING AUTH # [id number]

## 2021-02-21 ENCOUNTER — Other Ambulatory Visit: Payer: Self-pay

## 2021-02-21 ENCOUNTER — Ambulatory Visit (INDEPENDENT_AMBULATORY_CARE_PROVIDER_SITE_OTHER): Payer: 59

## 2021-02-21 DIAGNOSIS — Z1231 Encounter for screening mammogram for malignant neoplasm of breast: Secondary | ICD-10-CM

## 2021-02-21 DIAGNOSIS — N6001 Solitary cyst of right breast: Secondary | ICD-10-CM | POA: Diagnosis not present

## 2021-02-27 ENCOUNTER — Other Ambulatory Visit: Payer: Self-pay | Admitting: Podiatry

## 2021-02-27 ENCOUNTER — Encounter: Payer: Self-pay | Admitting: Podiatry

## 2021-02-27 DIAGNOSIS — M722 Plantar fascial fibromatosis: Secondary | ICD-10-CM | POA: Diagnosis not present

## 2021-02-27 DIAGNOSIS — M216X2 Other acquired deformities of left foot: Secondary | ICD-10-CM | POA: Diagnosis not present

## 2021-02-27 MED ORDER — ONDANSETRON HCL 4 MG PO TABS: 4.0000 mg | ORAL_TABLET | Freq: Three times a day (TID) | ORAL | 0 refills | Status: DC | PRN

## 2021-02-27 MED ORDER — CEPHALEXIN 500 MG PO CAPS: 500.0000 mg | ORAL_CAPSULE | Freq: Three times a day (TID) | ORAL | 0 refills | Status: DC

## 2021-02-27 MED ORDER — OXYCODONE-ACETAMINOPHEN 5-325 MG PO TABS: 1.0000 | ORAL_TABLET | Freq: Four times a day (QID) | ORAL | 0 refills | Status: DC | PRN

## 2021-02-27 NOTE — Progress Notes (Signed)
Postop medications sent 

## 2021-02-28 ENCOUNTER — Telehealth: Payer: Self-pay | Admitting: *Deleted

## 2021-02-28 NOTE — Telephone Encounter (Signed)
Called and spoke with the patient and stated that I was calling to see how the patient was doing and patient stated that she was doing ok and there was not any fever or chills and not any nausea and still felt numb from the nerve block and I stated to the patient that was normal and could last up to 72 hours and patient asked if she could take boot off some and I stated yes but to make sure it was on when getting up and I stated to call the office if any concerns or questions. Tonya Banks

## 2021-03-04 ENCOUNTER — Other Ambulatory Visit: Payer: Self-pay

## 2021-03-04 ENCOUNTER — Ambulatory Visit (INDEPENDENT_AMBULATORY_CARE_PROVIDER_SITE_OTHER): Payer: 59 | Admitting: Podiatry

## 2021-03-04 DIAGNOSIS — M722 Plantar fascial fibromatosis: Secondary | ICD-10-CM

## 2021-03-04 DIAGNOSIS — M216X2 Other acquired deformities of left foot: Secondary | ICD-10-CM

## 2021-03-11 NOTE — Progress Notes (Signed)
Subjective: Tonya Banks is a 60 y.o. is seen today in office s/p left foot gastrocnemius recession, EPF, PRP injection preformed on 02/27/2021.  States her pain is controlled.  She is alternating ibuprofen and Tylenol.  She has been nonweightbearing using a cam boot.  Denies any systemic complaints such as fevers, chills, nausea, vomiting. No calf pain, chest pain, shortness of breath.   Objective: General: No acute distress, AAOx3  DP/PT pulses palpable 2/4, CRT < 3 sec to all digits.  Protective sensation intact. Motor function intact.  Left foot: Incision is well coapted without any evidence of dehiscence. There is no surrounding erythema, ascending cellulitis, fluctuance, crepitus, malodor, drainage/purulence. There is mild edema around the surgical site. There is mild pain along the surgical site.  No other areas of tenderness to bilateral lower extremities.  No other open lesions or pre-ulcerative lesions.  No pain with calf compression, swelling, warmth, erythema.   Assessment and Plan:  Status post left foot surgery, doing well with no complications   -Treatment options discussed including all alternatives, risks, and complications -Dressings were changed.  Xeroform was applied followed by dry sterile dressing.  Keep dressing clean, dry, intact. -Ice/elevation -Pain medication as needed. -Monitor for any clinical signs or symptoms of infection and DVT/PE and directed to call the office immediately should any occur or go to the ER. -Follow-up for possible suture removal or sooner if any problems arise. In the meantime, encouraged to call the office with any questions, concerns, change in symptoms.   Celesta Gentile, DPM

## 2021-03-14 ENCOUNTER — Ambulatory Visit (INDEPENDENT_AMBULATORY_CARE_PROVIDER_SITE_OTHER): Payer: 59 | Admitting: Podiatry

## 2021-03-14 ENCOUNTER — Other Ambulatory Visit: Payer: Self-pay

## 2021-03-14 DIAGNOSIS — M722 Plantar fascial fibromatosis: Secondary | ICD-10-CM

## 2021-03-14 DIAGNOSIS — M216X2 Other acquired deformities of left foot: Secondary | ICD-10-CM

## 2021-03-16 NOTE — Progress Notes (Signed)
Subjective: Tonya Banks is a 60 y.o. is seen today in office s/p left foot gastrocnemius recession, EPF, PRP injection preformed on 02/27/2021.  Overall she states that she is doing better.  She has been doing some range of motion exercises for her ankle which is been helping.  She denies any increase in pain or any recent injury.  No fevers or chills.  She has no other concerns.   Objective: General: No acute distress, AAOx3  DP/PT pulses palpable 2/4, CRT < 3 sec to all digits.  Protective sensation intact. Motor function intact.  Left foot: Incision is well coapted without any evidence of dehiscence.  There is no surrounding erythema, drainage or pus or ascending cellulitis.  No obvious signs of infection.  No significant tenderness palpation on the incisions today.  On the plantar fascia mild discomfort.  There is still some localized edema to this area but there is no erythema or warmth associated with it. No other open lesions or pre-ulcerative lesions.  No pain with calf compression, swelling, warmth, erythema.   Assessment and Plan:  Status post left foot surgery, doing well with no complications   -Treatment options discussed including all alternatives, risks, and complications -Sutures were removed today without any issues incision is well coapted.  Small amount of antibiotic ointment was applied followed by dressing.  She will start to wash with soap and water and dry thoroughly and apply bandage.  Continue range of motion exercises.  Discussed that as she starts to feel better next week she can transition to partial weightbearing but still in the cam boot.  As she progressed without then she can transition to weightbearing as tolerated in the cam boot.  I will see her back in 2 weeks and at that point likely start physical therapy.  Continue to ice and elevate.  Trula Slade DPM

## 2021-03-28 ENCOUNTER — Ambulatory Visit (INDEPENDENT_AMBULATORY_CARE_PROVIDER_SITE_OTHER): Payer: 59 | Admitting: Podiatry

## 2021-03-28 ENCOUNTER — Other Ambulatory Visit: Payer: Self-pay

## 2021-03-28 DIAGNOSIS — M216X2 Other acquired deformities of left foot: Secondary | ICD-10-CM

## 2021-03-28 DIAGNOSIS — M722 Plantar fascial fibromatosis: Secondary | ICD-10-CM

## 2021-03-28 NOTE — Progress Notes (Signed)
Subjective: Tonya Banks is a 60 y.o. is seen today in office s/p left foot gastrocnemius recession, EPF, PRP injection preformed on 02/27/2021.  She states that she has been doing better and has been partial weightbearing in the cam boot utilizing 2 crutches.  Has been doing some range of motion exercises at home.  She has not been having much soreness.  Gets an occasional burning sensation but not consistently.  No significant swelling.  No other concerns today.   Objective: General: No acute distress, AAOx3  DP/PT pulses palpable 2/4, CRT < 3 sec to all digits.  Protective sensation intact. Motor function intact.  Left foot: Incision is well coapted without any evidence of dehiscence.  There is no significant tenderness palpation at surgical sites.  There is no surrounding erythema, drainage or pus or any obvious signs of infection noted.  No significant tenderness palpation along surgical sites.  No pain with calf compression. No pain with calf compression, swelling, warmth, erythema.   Assessment and Plan:  Status post left foot surgery, doing well with no complications   -Treatment options discussed including all alternatives, risks, and complications -At this point she has been continue to improve.  Discussed with her continue partial weightbearing for now but she can progress to weightbearing as tolerated in the cam boot as she continues to feel better and the strength is improving.  I would recommend referral to physical therapy.  When she is able to be weight-bear as tolerated in the cam boot then she can transition to regular shoe gradually.  Continue ice and elevate.  Discussed compression sock to help with any postoperative edema.  Trula Slade DPM

## 2021-04-10 ENCOUNTER — Other Ambulatory Visit: Payer: Self-pay

## 2021-04-10 ENCOUNTER — Ambulatory Visit: Payer: 59 | Admitting: Rehabilitative and Restorative Service Providers"

## 2021-04-10 DIAGNOSIS — R2689 Other abnormalities of gait and mobility: Secondary | ICD-10-CM

## 2021-04-10 DIAGNOSIS — M6281 Muscle weakness (generalized): Secondary | ICD-10-CM | POA: Diagnosis not present

## 2021-04-10 DIAGNOSIS — M25572 Pain in left ankle and joints of left foot: Secondary | ICD-10-CM | POA: Diagnosis not present

## 2021-04-10 NOTE — Patient Instructions (Signed)
Access Code: [email or web address] URL: [email or web address] Date: 04/10/2021 Prepared by: Rudell Cobb  Exercises Seated Ankle Alphabet - 2 x daily - 7 x weekly - 1 sets - 1 reps Isometric Ankle Inversion - 2 x daily - 7 x weekly - 1 sets - 10 reps Isometric Ankle Dorsiflexion and Plantarflexion - 2 x daily - 7 x weekly - 1 sets - 10 reps Long Sitting Isometric Ankle Plantarflexion with Ball at [employer] - 2 x daily - 7 x weekly - 1 sets - 10 reps Seated Ankle Eversion AROM - 2 x daily - 7 x weekly - 1 sets - 10 reps

## 2021-04-10 NOTE — Therapy (Signed)
[place] [address], [place], [address] Phone: [phone]   Fax:  [phone]  Physical Therapy Evaluation  Patient Details  Name: Tonya Banks MRN: [id number] Date of Birth: 1960-11-11 Referring Provider (PT): Celesta Gentile, DPM   Encounter Date: 04/10/2021   PT End of Session - 04/10/21 1006     Visit Number 1    Number of Visits 12    Date for PT Re-Evaluation 05/22/21    Authorization Type [employer]    PT Start Time 0803    PT Stop Time 0848    PT Time Calculation (min) 45 min             No past medical history on file.  Past Surgical History:  Procedure Laterality Date   APPENDECTOMY     BREAST BIOPSY Left    stereo 2017   breast lift  2011   REDUCTION MAMMAPLASTY Bilateral 1992   TUBAL LIGATION     tummy tuck     uterine ablation  2000    There were no vitals filed for this visit.    Subjective Assessment - 04/10/21 0805     Subjective The patient had a h/o L plantar fascitis + heel pain x years.  She underwent multiple conservative treatments over the years and had surgery in 02/27/21.  She underwent gastrocnemius recession, EPF, PRP injection.   She is walking with the boot and one crutch.  She felt the ankle wanting to roll when trying to walk in boot without the crutch.  The work schedule is challenging to get to PT regularly.    Patient Stated Goals Be able to walk without severe pain.  Has a cruise in November.    Currently in Pain? Yes    Pain Score 0-No pain   None when wearing the boot.   Pain Location Foot    Pain Orientation Left    Aggravating Factors  back incision has some discomfort in the boot                [facility] PT Assessment - 04/10/21 0817       Assessment   Medical Diagnosis plantar fascitis of the left foot    Referring Provider (PT) Celesta Gentile, DPM    Onset Date/Surgical Date 02/27/21    Prior Therapy tried therapy/exercise in the past       Precautions   Precautions Other (comment)    Precaution Comments post surgical precautions:  continue partial weightbearing for now but she can progress to weightbearing as tolerated in the cam boot as she continues to feel better and the strength is improving.  I would recommend referral to physical therapy.  When she is able to be weight-bear as tolerated in the cam boot then she can transition to regular shoe gradually    Required Braces or Orthoses --   CAM walker boot     Restrictions   Weight Bearing Restrictions Yes    LLE Weight Bearing Weight bearing as tolerated    Other Position/Activity Restrictions using one crutch with CAM Walker      Balance Screen   Has the patient fallen in the past 6 months No    Has the patient had a decrease in activity level because of a fear of falling?  No    Is the patient reluctant to leave their home because of a fear of falling?  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)      Observation/Other Assessments   Focus on Therapeutic Outcomes (FOTO)  63%      Sensation   Light Touch Appears Intact      ROM / Strength   AROM / PROM / Strength AROM;Strength      AROM   Overall AROM  Deficits    AROM Assessment Site Ankle    Right/Left Ankle Left    Left Ankle Dorsiflexion 10    Left Ankle Plantar Flexion 40    Left Ankle Inversion 40    Left Ankle Eversion 30      Strength   Overall Strength Deficits    Overall Strength Comments formal MMT not performed due to 6 weeks post surgical (began with isometrics today)      Flexibility   Soft Tissue Assessment /Muscle Length yes   gastrocs has good length-- patient has been doing gentle stretching at  home     Palpation   Palpation comment tenderness to palpation along incision (well approximated) in posterior leg (gastroc).  Incisions at plantar surface of foot are well approximated.      Special Tests   Other special tests Joint mobility is limited on R and L side for ankle eversion and lateral glide.  Maintains a supinated position at heel      Ambulation/Gait   Ambulation/Gait Yes    Ambulation/Gait Assistance 6: Modified independent (Device/Increase time)    Ambulation Distance (Feet) 100 Feet    Assistive device R Axillary Crutch    Gait Pattern Step-through pattern;Decreased stance time - left    Gait Comments using boot/ Cam walker                        Objective measurements completed on examination: See above findings.       [facility] PT Treatment/Exercise - 04/10/21 0817       Exercises   Exercises Ankle      Ankle Exercises: Seated   ABC's 1 rep    Ankle Circles/Pumps AROM    Other Seated Ankle Exercises isometrics in all planes (long sitting).  Hard to elicit contraction of the L ankle evertors (ant tib takes over motion); modified and worked to elicit eversion isolated contraction                     PT Education - 04/10/21 1006     Education Details HEP    Person(s) Educated Patient    Methods Explanation;Demonstration;Handout    Comprehension Verbalized understanding;Returned demonstration                 PT Long Term Goals - 04/10/21 1007       PT LONG TERM GOAL #1   Title The patient will be indep with HEP for progressive strengthening and loading L LE.    Time 6    Period Weeks    Target Date 05/22/21      PT LONG TERM GOAL #2   Title The patient will improve functioal status score from 63% to > or equal to 72%.    Time 6    Period Weeks  Target Date 05/22/21      PT LONG TERM GOAL #3   Title The patient will progress from gait in Cam walker to a tennis shoe (incrementally) for gait at home and  community.    Time 6    Period Weeks    Target Date 05/22/21      PT LONG TERM GOAL #4   Title The patient will have > or equal to 4/5 strength in L ankle (did not MMT today due to post surgical) for functional strength.    Time 6    Period Weeks    Target Date 05/22/21                    Plan - 04/10/21 1043     Clinical Impression Statement The patient is a 60 year old female presenting to OP physical therapy with chronic h/o L ankle and foot pain s/p gastrocnemius recession, EPF, PRP injection on 02/27/21.  She presents with impairments of dec'd ability to accept load through the L LE for functional gait.  She also has some soreness over posterior incision site and will benefit from scar massage.  Pt is 6 weeks post surgery, and tolerated isometrics well today. Due to work schedule, we anticipate only being able to schedule 1x/week (wrote plan for 2x to have flexibility if further needs arise).    Personal Factors and Comorbidities Time since onset of injury/illness/exacerbation    Examination-Activity Limitations Locomotion Level;Stairs;Stand;Squat    Stability/Clinical Decision Making Stable/Uncomplicated    Clinical Decision Making Low    Rehab Potential Good    PT Frequency 2x / week    PT Duration 6 weeks    PT Treatment/Interventions Taping;Dry needling;Manual techniques;Therapeutic activities;Therapeutic exercise;ADLs/Self Care Home Management;Aquatic Therapy;Cryotherapy;Electrical Stimulation;Iontophoresis '4mg'$ /ml Dexamethasone;Moist Heat;Gait training;Stair training;Functional mobility training;Balance training;Patient/family education;Orthotic Fit/Training    PT Next Visit Plan check HEP and progress from isometrics to isotonic exercise as able; marble pick up, towel scrunch.  Work on Personnel officer initially with boot donned reducing use of crutch.  When able to tolerate will slowly progress through WB in PT with tennis shoe and slowly wean out of boot.    PT Home  Exercise Plan [email or web address]    Consulted and Agree with Plan of Care Patient             Patient will benefit from skilled therapeutic intervention in order to improve the following deficits and impairments:  Pain, Decreased strength, Abnormal gait, Increased fascial restricitons  Visit Diagnosis: Pain in left ankle and joints of left foot  Muscle weakness (generalized)  Other abnormalities of gait and mobility     Problem List Patient Active Problem List   Diagnosis Date Noted   Neuroma 08/16/2019   Plantar fasciitis of left foot 08/16/2019   Situational stress 08/08/2019   Breast mass, right 12/08/2017   Breast pain, right 12/08/2017   Neuropathy 03/21/2016   Left shoulder pain 10/04/2015   RBBB 12/09/2011   KNEE PAIN, RIGHT 03/12/2010   BACK PAIN 12/12/2009   IRRITABLE BOWEL SYNDROME 09/18/2008   SPRAIN/STRAIN, NECK 08/19/2006   HYPERCHOLESTEROLEMIA 05/05/2006   GASTROESOPHAGEAL REFLUX, NO ESOPHAGITIS 05/05/2006    Shirelle Tootle, PT 04/10/2021, 10:51 AM  [facility] [place], [place], [address] Phone: [phone]   Fax:  [phone]  Name: Tonya Banks MRN: [id number] Date of Birth: 1961-02-22

## 2021-04-18 ENCOUNTER — Ambulatory Visit (INDEPENDENT_AMBULATORY_CARE_PROVIDER_SITE_OTHER): Payer: 59 | Admitting: Podiatry

## 2021-04-18 ENCOUNTER — Other Ambulatory Visit: Payer: Self-pay

## 2021-04-18 DIAGNOSIS — M216X2 Other acquired deformities of left foot: Secondary | ICD-10-CM

## 2021-04-18 DIAGNOSIS — M722 Plantar fascial fibromatosis: Secondary | ICD-10-CM

## 2021-04-24 ENCOUNTER — Other Ambulatory Visit: Payer: Self-pay

## 2021-04-24 ENCOUNTER — Ambulatory Visit: Payer: 59 | Admitting: Rehabilitative and Restorative Service Providers"

## 2021-04-24 DIAGNOSIS — M25572 Pain in left ankle and joints of left foot: Secondary | ICD-10-CM | POA: Diagnosis not present

## 2021-04-24 DIAGNOSIS — R2689 Other abnormalities of gait and mobility: Secondary | ICD-10-CM | POA: Diagnosis not present

## 2021-04-24 DIAGNOSIS — M6281 Muscle weakness (generalized): Secondary | ICD-10-CM | POA: Diagnosis not present

## 2021-04-24 NOTE — Progress Notes (Signed)
Subjective: Tonya Banks is a 60 y.o. is seen today in office s/p left foot gastrocnemius recession, EPF, PRP injection preformed on 02/27/2021.  Overall she states that she is doing well.  She presents today walking in the cam boot.  She has some slight tenderness mostly along the gastrocnemius recession site but no pain to the bottom of the heel.  She has no recent injury or changes otherwise since I last saw her.  No fevers or chills.  She has no other concerns.    Objective: General: No acute distress, AAOx3  DP/PT pulses palpable 2/4, CRT < 3 sec to all digits.  Protective sensation intact. Motor function intact.  Left foot: Incision is well coapted without any evidence of dehiscence.  Scars are forming.  There is no erythema or warmth.  There is slight swelling present.  There is no significant area of tenderness to palpation on exam to the plantar fascia or the gastrocnemius today.  Ankle, subtalar joint range of motion intact. No pain with calf compression, swelling, warmth, erythema.   Assessment and Plan:  Status post left foot surgery, doing well with no complications   -Treatment options discussed including all alternatives, risks, and complications -Overall she is continue make good progress.  She can start to transition to regular shoe as tolerated.  Continue ice and elevate.  Continue stretching, rehab.  Trula Slade DPM

## 2021-04-24 NOTE — Therapy (Signed)
[place], [place], [address] Phone: [phone]   Fax:  [phone]  Physical Therapy Treatment  Patient Details  Name: Tonya Banks MRN: [id number] Date of Birth: 06/13/1961 Referring Provider (PT): Celesta Gentile, DPM   Encounter Date: 04/24/2021   PT End of Session - 04/24/21 0807     Visit Number 2    Number of Visits 12    Date for PT Re-Evaluation 05/22/21    Authorization Type [employer]    PT Start Time 0803    PT Stop Time 0845    PT Time Calculation (min) 42 min    Activity Tolerance Patient tolerated treatment well    Behavior During Therapy [facility] for tasks assessed/performed             No past medical history on file.  Past Surgical History:  Procedure Laterality Date   APPENDECTOMY     BREAST BIOPSY Left    stereo 2017   breast lift  2011   REDUCTION MAMMAPLASTY Bilateral 1992   TUBAL LIGATION     tummy tuck     uterine ablation  2000    There were no vitals filed for this visit.   Subjective Assessment - 04/24/21 0805     Subjective The patient only gets a twinge intermittently in the L LE.  She notes she is progressing well with ther ex and walking in the home without the boot.    Patient Stated Goals Be able to walk without severe pain.  Has a cruise in November.    Currently in Pain? No/denies                [facility] PT Assessment - 04/24/21 2094       Assessment   Medical Diagnosis plantar fascitis of the left foot    Referring Provider (PT) Celesta Gentile, DPM    Onset Date/Surgical Date 02/27/21      Precautions   Precautions Other (comment)    Precaution Comments post surgical precautions:  continue partial weightbearing for now but she can progress to weightbearing as tolerated in the cam boot as she continues to feel better and the strength is improving.  I would recommend referral to physical therapy.  When she is able to be weight-bear as  tolerated in the cam boot then she can transition to regular shoe gradually      Restrictions   LLE Weight Bearing Weight bearing as tolerated                           [facility] Adult PT Treatment/Exercise - 04/24/21 0809       Ambulation/Gait   Ambulation/Gait Yes    Ambulation/Gait Assistance 6: Modified independent (Device/Increase time)    Ambulation Distance (Feet) 200 Feet    Assistive device None    Gait Pattern Step-through pattern;Decreased stance time - left;Antalgic    Gait Comments with shoe donned      Exercises   Exercises Ankle      Manual Therapy   Manual Therapy Soft tissue mobilization;Joint mobilization    Manual therapy comments discussed self mobilization of scar and of soleous musculature using rolling massage stick or tennis ball    Joint Mobilization gentle grade I mobs ankle and metatarsals    Soft tissue mobilization soleous, gastroc, and scar tissue mobilization      Ankle Exercises: Stretches   Plantar Fascia Stretch 1 rep  Plantar Fascia Stretch Limitations rolled tennis ball under plantar surface gently x 2 minutes    Gastroc Stretch 2 reps;30 seconds    Gastroc Stretch Limitations in standing near countertop for support      Ankle Exercises: Aerobic   Nustep Level 5 x 4 minutes LEs only      Ankle Exercises: Seated   Towel Crunch 5 reps    [place]ble Pickup L foot x 8 reps x 2 sets    Other Seated Ankle Exercises yellow band in all 4 planes x 10 reps without discomfort                          PT Long Term Goals - 04/10/21 1007       PT LONG TERM GOAL #1   Title The patient will be indep with HEP for progressive strengthening and loading L LE.    Time 6    Period Weeks    Target Date 05/22/21      PT LONG TERM GOAL #2   Title The patient will improve functioal status score from 63% to > or equal to 72%.    Time 6    Period Weeks    Target Date 05/22/21      PT LONG TERM GOAL #3   Title The patient  will progress from gait in Cam walker to a tennis shoe (incrementally) for gait at home and community.    Time 6    Period Weeks    Target Date 05/22/21      PT LONG TERM GOAL #4   Title The patient will have > or equal to 4/5 strength in L ankle (did not MMT today due to post surgical) for functional strength.    Time 6    Period Weeks    Target Date 05/22/21                   Plan - 04/24/21 1301     Clinical Impression Statement The patient has made progress since initial evaluation and is now walking some in her home without the boot.  She ws able to tolerate progression from isometric to isotonic ther ex today with low resistance.    PT Treatment/Interventions Taping;Dry needling;Manual techniques;Therapeutic activities;Therapeutic exercise;ADLs/Self Care Home Management;Aquatic Therapy;Cryotherapy;Electrical Stimulation;Iontophoresis 4mg /ml Dexamethasone;Moist Heat;Gait training;Stair training;Functional mobility training;Balance training;Patient/family education;Orthotic Fit/Training    PT Next Visit Plan Progress from yellow band to red band; standing weight shift without boot and gentle standing stretches-- working to normalize gait; STM and scar massage (soleous); gentle soft tissue mobility    PT Home Exercise Plan [email or web address]    Consulted and Agree with Plan of Care Patient             Patient will benefit from skilled therapeutic intervention in order to improve the following deficits and impairments:     Visit Diagnosis: Pain in left ankle and joints of left foot  Muscle weakness (generalized)  Other abnormalities of gait and mobility     Problem List Patient Active Problem List   Diagnosis Date Noted   Neuroma 08/16/2019   Plantar fasciitis of left foot 08/16/2019   Situational stress 08/08/2019   Breast mass, right 12/08/2017   Breast pain, right 12/08/2017   Neuropathy 03/21/2016   Left shoulder pain 10/04/2015   RBBB 12/09/2011   KNEE PAIN,  RIGHT 03/12/2010   BACK PAIN 12/12/2009   IRRITABLE BOWEL SYNDROME 09/18/2008   SPRAIN/STRAIN, NECK 08/19/2006  HYPERCHOLESTEROLEMIA 05/05/2006   GASTROESOPHAGEAL REFLUX, NO ESOPHAGITIS 05/05/2006    Macallister Ashmead, PT 04/24/2021, 1:25 PM  [facility] [place], [place], [address] Phone: [phone]   Fax:  [phone]  Name: Tonya Banks MRN: [id number] Date of Birth: 09-26-1960

## 2021-04-27 ENCOUNTER — Other Ambulatory Visit: Payer: Self-pay | Admitting: Family Medicine

## 2021-04-27 DIAGNOSIS — F439 Reaction to severe stress, unspecified: Secondary | ICD-10-CM

## 2021-04-28 ENCOUNTER — Other Ambulatory Visit: Payer: Self-pay | Admitting: Family Medicine

## 2021-04-28 DIAGNOSIS — E7800 Pure hypercholesterolemia, unspecified: Secondary | ICD-10-CM

## 2021-05-01 ENCOUNTER — Ambulatory Visit: Payer: 59 | Admitting: Physical Therapy

## 2021-05-01 ENCOUNTER — Other Ambulatory Visit: Payer: Self-pay

## 2021-05-01 DIAGNOSIS — M6281 Muscle weakness (generalized): Secondary | ICD-10-CM

## 2021-05-01 DIAGNOSIS — R2689 Other abnormalities of gait and mobility: Secondary | ICD-10-CM

## 2021-05-01 DIAGNOSIS — M25572 Pain in left ankle and joints of left foot: Secondary | ICD-10-CM

## 2021-05-01 NOTE — Patient Instructions (Signed)
Access Code: [email or web address] URL: [email or web address] Date: 05/01/2021 Prepared by: Isabelle Course  Exercises Seated Ankle Alphabet - 2 x daily - 7 x weekly - 1 sets - 1 reps Seated Plantar Fascia Mobilization with Small Ball - 2 x daily - 7 x weekly - 1 sets - 10 reps Calf Mobilization with Small Ball - 2 x daily - 7 x weekly - 1 sets - 1 reps Seated Ankle Dorsiflexion with Resistance - 2 x daily - 7 x weekly - 1 sets - 10 reps Seated Ankle Plantar Flexion with Resistance Loop - 2 x daily - 7 x weekly - 1 sets - 10 reps Seated Ankle Inversion with Anchored Resistance - 2 x daily - 7 x weekly - 1 sets - 10 reps Seated Ankle Eversion with Anchored Resistance - 2 x daily - 7 x weekly - 1 sets - 10 reps Standing Single Leg Stance with Counter Support - 1 x daily - 7 x weekly - 1 sets - 10 reps - 15-20 sec hold Soleus Stretch on Wall - 1 x daily - 7 x weekly - 3 sets - 1 reps - 20-30 sec hold Gastroc Stretch on Wall - 1 x daily - 7 x weekly - 3 sets - 1 reps - 20-30 sec hold Heel Raises with Counter Support - 1 x daily - 7 x weekly - 2 sets - 10 reps Heel Toe Raises with Counter Support - 1 x daily - 7 x weekly - 2 sets - 10 reps

## 2021-05-01 NOTE — Therapy (Signed)
[place], [place], [address] Phone: [phone]   Fax:  [phone]  Physical Therapy Treatment  Patient Details  Name: Tonya Banks MRN: [id number] Date of Birth: 07-28-1961 Referring Provider (PT): Celesta Gentile, DPM   Encounter Date: 05/01/2021   PT End of Session - 05/01/21 0841     Visit Number 3    Number of Visits 12    Date for PT Re-Evaluation 05/22/21    Authorization Type [employer]    PT Start Time 0800    PT Stop Time 0840    PT Time Calculation (min) 40 min    Activity Tolerance Patient tolerated treatment well    Behavior During Therapy [facility] for tasks assessed/performed             No past medical history on file.  Past Surgical History:  Procedure Laterality Date   APPENDECTOMY     BREAST BIOPSY Left    stereo 2017   breast lift  2011   REDUCTION MAMMAPLASTY Bilateral 1992   TUBAL LIGATION     tummy tuck     uterine ablation  2000    There were no vitals filed for this visit.   Subjective Assessment - 05/01/21 0803     Subjective Pt states she is wearing her tennis shoes most of the time at work, she wears a "dansko" type shoe with arch support at home. most of her pain is where her incision is around achilles area, no pain in foot    Patient Stated Goals Be able to walk without severe pain.  Has a cruise in November.    Currently in Pain? No/denies                [facility] PT Assessment - 05/01/21 0001       Observation/Other Assessments   Focus on Therapeutic Outcomes (FOTO)  69      Strength   Strength Assessment Site Ankle    Right/Left Ankle Left    Left Ankle Dorsiflexion 4/5    Left Ankle Plantar Flexion 4/5    Left Ankle Inversion 4-/5    Left Ankle Eversion 3/5                           [facility] Adult PT Treatment/Exercise - 05/01/21 0001       Ambulation/Gait   Ambulation/Gait Assistance 7: Independent    Ambulation  Distance (Feet) 200 Feet    Assistive device None    Gait Pattern Step-through pattern    Stairs Yes    Stairs Assistance 7: Independent    Stair Management Technique No rails    Number of Stairs 10    Gait Comments with shoe      Manual Therapy   Soft tissue mobilization soleous, gastroc, and scar tissue mobilization      Ankle Exercises: Aerobic   Nustep Level 5 x 4 minutes LEs only      Ankle Exercises: Stretches   Soleus Stretch 1 rep;30 seconds    Gastroc Stretch 2 reps;30 seconds      Ankle Exercises: Standing   SLS Lt LE up to 15 sec, multiple attempts    Heel Raises 10 reps    Toe Raise 10 reps      Ankle Exercises: Seated   Other Seated Ankle Exercises red band x 15 all directions  PT Education - 05/01/21 0836     Education Details updated HEP    Person(s) Educated Patient    Methods Explanation;Handout;Demonstration    Comprehension Verbalized understanding;Returned demonstration                 PT Long Term Goals - 05/01/21 0837       PT LONG TERM GOAL #1   Title The patient will be indep with HEP for progressive strengthening and loading L LE.    Status On-going    Target Date 05/22/21      PT LONG TERM GOAL #2   Title The patient will improve functioal status score from 63% to > or equal to 72%.    Baseline 69 on 05/01/21    Status On-going    Target Date 05/22/21      PT LONG TERM GOAL #3   Title The patient will progress from gait in Cam walker to a tennis shoe (incrementally) for gait at home and community.    Status Achieved      PT LONG TERM GOAL #4   Title The patient will have > or equal to 4/5 strength in L ankle (did not MMT today due to post surgical) for functional strength.    Status On-going    Target Date 05/22/21                   Plan - 05/01/21 0842     Clinical Impression Statement Pt has improved gait and is able to perform gait and stairs with independence while wearing shoe.  Pt has improved FOTO to 69 from 63. HEP updated to include standing strengthening and stretching and SLS for balance. Pt will check in in 2 weeks    PT Next Visit Plan plan for d/c, progress standing strength and balance    PT Home Exercise Plan [email or web address]    Consulted and Agree with Plan of Care Patient             Patient will benefit from skilled therapeutic intervention in order to improve the following deficits and impairments:     Visit Diagnosis: Pain in left ankle and joints of left foot  Muscle weakness (generalized)  Other abnormalities of gait and mobility     Problem List Patient Active Problem List   Diagnosis Date Noted   Neuroma 08/16/2019   Plantar fasciitis of left foot 08/16/2019   Situational stress 08/08/2019   Breast mass, right 12/08/2017   Breast pain, right 12/08/2017   Neuropathy 03/21/2016   Left shoulder pain 10/04/2015   RBBB 12/09/2011   KNEE PAIN, RIGHT 03/12/2010   BACK PAIN 12/12/2009   IRRITABLE BOWEL SYNDROME 09/18/2008   SPRAIN/STRAIN, NECK 08/19/2006   HYPERCHOLESTEROLEMIA 05/05/2006   GASTROESOPHAGEAL REFLUX, NO ESOPHAGITIS 05/05/2006    Vere Diantonio, PT 05/01/2021, 8:44 AM  [facility] [place], [place], [address] Phone: [phone]   Fax:  [phone]  Name: Tonya Banks MRN: [id number] Date of Birth: 12/30/1960

## 2021-05-08 ENCOUNTER — Encounter: Payer: 59 | Admitting: Physical Therapy

## 2021-05-17 ENCOUNTER — Other Ambulatory Visit: Payer: Self-pay

## 2021-05-17 ENCOUNTER — Ambulatory Visit: Payer: 59 | Admitting: Physical Therapy

## 2021-05-17 DIAGNOSIS — M25572 Pain in left ankle and joints of left foot: Secondary | ICD-10-CM | POA: Diagnosis not present

## 2021-05-17 DIAGNOSIS — M6281 Muscle weakness (generalized): Secondary | ICD-10-CM

## 2021-05-17 DIAGNOSIS — R2689 Other abnormalities of gait and mobility: Secondary | ICD-10-CM | POA: Diagnosis not present

## 2021-05-17 NOTE — Therapy (Signed)
[place], [place], [address] Phone: [phone]   Fax:  [phone]  Physical Therapy Treatment and Discharge  Patient Details  Name: Tonya Banks MRN: [id number] Date of Birth: [birth year] Referring Provider (PT): Celesta Gentile   Encounter Date: 05/17/2021   PT End of Session - 05/17/21 0829     Visit Number 4    Number of Visits 12    Date for PT Re-Evaluation 05/22/21    PT Start Time 0800    PT Stop Time 0830    PT Time Calculation (min) 30 min    Activity Tolerance Patient tolerated treatment well    Behavior During Therapy [facility] for tasks assessed/performed             No past medical history on file.  Past Surgical History:  Procedure Laterality Date   APPENDECTOMY     BREAST BIOPSY Left    stereo 2017   breast lift  2011   REDUCTION MAMMAPLASTY Bilateral 1992   TUBAL LIGATION     tummy tuck     uterine ablation  2000    There were no vitals filed for this visit.   Subjective Assessment - 05/17/21 0800     Subjective Pt states she has been consistently wearing her shoe and is feeling "close to back to normal". The only stiffness is after sitting for a long time    Patient Stated Goals Be able to walk without severe pain.  Has a cruise in November.    Currently in Pain? No/denies                [facility] PT Assessment - 05/17/21 0001       Assessment   Medical Diagnosis Plantar fascitis of left foot    Referring Provider (PT) Celesta Gentile    Onset Date/Surgical Date 02/27/21      AROM   Left Ankle Dorsiflexion 14    Left Ankle Plantar Flexion 50    Left Ankle Inversion 30    Left Ankle Eversion 45      Strength   Left Ankle Dorsiflexion 4+/5    Left Ankle Plantar Flexion 4+/5    Left Ankle Inversion 4/5    Left Ankle Eversion 4/5                           [facility] Adult PT Treatment/Exercise - 05/17/21 0001       Ankle  Exercises: Aerobic   Nustep Level 5 x 4 minutes LEs only      Ankle Exercises: Seated   Other Seated Ankle Exercises green band x 15 all directions      Ankle Exercises: Stretches   Soleus Stretch 1 rep;30 seconds    Gastroc Stretch 2 reps;30 seconds      Ankle Exercises: Standing   SLS Lt LE up to 25 sec on floor, standing on foam up to 10 sec    Rocker Board 1 minute   1 min balance challenge without UE support, 10 taps A/P   Heel Raises 20 reps    Toe Raise 20 reps                          PT Long Term Goals - 05/17/21 0810       PT LONG TERM GOAL #1   Title The patient will be indep with HEP for progressive  strengthening and loading L LE.    Status Achieved      PT LONG TERM GOAL #2   Title The patient will improve functioal status score from 63% to > or equal to 72%.    Baseline 69 on 05/01/21    Status Not Met      PT LONG TERM GOAL #3   Title The patient will progress from gait in Cam walker to a tennis shoe (incrementally) for gait at home and community.    Status Achieved      PT LONG TERM GOAL #4   Title The patient will have > or equal to 4/5 strength in L ankle (did not MMT today due to post surgical) for functional strength.    Status Achieved                   Plan - 05/17/21 0830     Clinical Impression Statement Pt has met goals and has improved strength, ROM and gait and balance and is ready to d/c from PT to HEP    PT Next Visit Plan d/c    PT Home Exercise Plan [email or web address]    Consulted and Agree with Plan of Care Patient             Patient will benefit from skilled therapeutic intervention in order to improve the following deficits and impairments:     Visit Diagnosis: Pain in left ankle and joints of left foot  Muscle weakness (generalized)  Other abnormalities of gait and mobility     Problem List Patient Active Problem List   Diagnosis Date Noted   Neuroma 08/16/2019   Plantar fasciitis of left foot  08/16/2019   Situational stress 08/08/2019   Breast mass, right 12/08/2017   Breast pain, right 12/08/2017   Neuropathy 03/21/2016   Left shoulder pain 10/04/2015   RBBB 12/09/2011   KNEE PAIN, RIGHT 03/12/2010   BACK PAIN 12/12/2009   IRRITABLE BOWEL SYNDROME 09/18/2008   SPRAIN/STRAIN, NECK 08/19/2006   HYPERCHOLESTEROLEMIA 05/05/2006   GASTROESOPHAGEAL REFLUX, NO ESOPHAGITIS 05/05/2006   PHYSICAL THERAPY DISCHARGE SUMMARY  Visits from Start of Care: 4  Current functional level related to goals / functional outcomes: Improved balance, gait, strength and ROM   Remaining deficits: See above    Education / Equipment: HEP   Patient agrees to discharge. Patient goals were partially met. Patient is being discharged due to being pleased with the current functional level.  Miller Limehouse, PT 05/17/2021, 8:32 AM  [facility] [place], [place], [address] Phone: [phone]   Fax:  [phone]  Name: Tonya Banks MRN: [id number] Date of Birth: 03-Sep-1960

## 2021-08-20 ENCOUNTER — Other Ambulatory Visit: Payer: Self-pay

## 2021-08-20 ENCOUNTER — Other Ambulatory Visit (HOSPITAL_COMMUNITY)
Admission: RE | Admit: 2021-08-20 | Discharge: 2021-08-20 | Disposition: A | Discharge status: Home / Self Care | Payer: 59 | Source: Ambulatory Visit | Attending: Family Medicine | Admitting: Family Medicine

## 2021-08-20 ENCOUNTER — Ambulatory Visit (INDEPENDENT_AMBULATORY_CARE_PROVIDER_SITE_OTHER): Payer: 59 | Admitting: Family Medicine

## 2021-08-20 ENCOUNTER — Encounter: Payer: Self-pay | Admitting: Family Medicine

## 2021-08-20 ENCOUNTER — Other Ambulatory Visit: Payer: Self-pay | Admitting: Family Medicine

## 2021-08-20 VITALS — BP 126/61 | HR 55 | Resp 18 | Ht 66.0 in | Wt 187.0 lb

## 2021-08-20 DIAGNOSIS — M170 Bilateral primary osteoarthritis of knee: Secondary | ICD-10-CM

## 2021-08-20 DIAGNOSIS — Z01419 Encounter for gynecological examination (general) (routine) without abnormal findings: Secondary | ICD-10-CM

## 2021-08-20 DIAGNOSIS — F439 Reaction to severe stress, unspecified: Secondary | ICD-10-CM | POA: Diagnosis not present

## 2021-08-20 DIAGNOSIS — M79671 Pain in right foot: Secondary | ICD-10-CM | POA: Diagnosis not present

## 2021-08-20 DIAGNOSIS — M25561 Pain in right knee: Secondary | ICD-10-CM | POA: Diagnosis not present

## 2021-08-20 DIAGNOSIS — L299 Pruritus, unspecified: Secondary | ICD-10-CM

## 2021-08-20 DIAGNOSIS — J019 Acute sinusitis, unspecified: Secondary | ICD-10-CM

## 2021-08-20 DIAGNOSIS — Z0000 Encounter for general adult medical examination without abnormal findings: Secondary | ICD-10-CM

## 2021-08-20 LAB — COMPLETE METABOLIC PANEL WITH GFR
AG Ratio: 2 (calc) (ref 1.0–2.5)
ALT: 16 U/L (ref 6–29)
AST: 17 U/L (ref 10–35)
Albumin: 4.6 g/dL (ref 3.6–5.1)
Alkaline phosphatase (APISO): 77 U/L (ref 37–153)
BUN: 13 mg/dL (ref 7–25)
CO2: 33 mmol/L — ABNORMAL HIGH (ref 20–32)
Calcium: 9.6 mg/dL (ref 8.6–10.4)
Chloride: 106 mmol/L (ref 98–110)
Creat: 0.69 mg/dL (ref 0.50–1.03)
Globulin: 2.3 g/dL (calc) (ref 1.9–3.7)
Glucose, Bld: 100 mg/dL — ABNORMAL HIGH (ref 65–99)
Potassium: 4.3 mmol/L (ref 3.5–5.3)
Sodium: 141 mmol/L (ref 135–146)
Total Bilirubin: 0.7 mg/dL (ref 0.2–1.2)
Total Protein: 6.9 g/dL (ref 6.1–8.1)
eGFR: 100 mL/min/{1.73_m2} (ref >=60)

## 2021-08-20 LAB — CBC
HCT: 41.8 % (ref 35.0–45.0)
Hemoglobin: 14.1 g/dL (ref 11.7–15.5)
MCH: 31.5 pg (ref 27.0–33.0)
MCHC: 33.7 g/dL (ref 32.0–36.0)
MCV: 93.5 fL (ref 80.0–100.0)
MPV: 10.1 fL (ref 7.5–12.5)
Platelets: 241 10*3/uL (ref 140–400)
RBC: 4.47 10*6/uL (ref 3.80–5.10)
RDW: 11.8 % (ref 11.0–15.0)
WBC: 4.6 10*3/uL (ref 3.8–10.8)

## 2021-08-20 LAB — LIPID PANEL W/REFLEX DIRECT LDL
Cholesterol: 175 mg/dL (ref <200)
HDL: 53 mg/dL (ref >=50)
LDL Cholesterol (Calc): 99 mg/dL (calc)
Non-HDL Cholesterol (Calc): 122 mg/dL (calc) (ref <130)
Total CHOL/HDL Ratio: 3.3 (calc) (ref <5.0)
Triglycerides: 130 mg/dL (ref <150)

## 2021-08-20 MED ORDER — VENLAFAXINE HCL ER 37.5 MG PO CP24: ORAL_CAPSULE | ORAL | 0 refills | Status: DC

## 2021-08-20 MED ORDER — DICLOFENAC SODIUM 1 % EX GEL: 4.0000 g | Freq: Four times a day (QID) | CUTANEOUS | 3 refills | Status: DC

## 2021-08-20 MED ORDER — FLUOCINOLONE ACETONIDE 0.01 % OT OIL: 2.0000 [drp] | TOPICAL_OIL | Freq: Every day | OTIC | 0 refills | Status: DC | PRN

## 2021-08-20 MED ORDER — VENLAFAXINE HCL ER 75 MG PO CP24: 75.0000 mg | ORAL_CAPSULE | Freq: Every day | ORAL | 0 refills | Status: DC

## 2021-08-20 MED ORDER — AMOXICILLIN-POT CLAVULANATE 875-125 MG PO TABS: 1.0000 | ORAL_TABLET | Freq: Two times a day (BID) | ORAL | 0 refills | Status: DC

## 2021-08-20 MED ORDER — CITALOPRAM HYDROBROMIDE 10 MG PO TABS
ORAL_TABLET | ORAL | 0 refills | Status: DC
Start: 2021-08-20 — End: 2021-11-11

## 2021-08-20 MED ORDER — EFINACONAZOLE 10 % EX SOLN: 1.0000 [drp] | Freq: Every day | CUTANEOUS | 2 refills | Status: DC

## 2021-08-20 NOTE — Progress Notes (Signed)
Subjective:     Tonya Banks is a 61 y.o. female and is here for a comprehensive physical exam. The patient reports problems - mood medication .she is feeling like her citalopram is no longer as effective as it was previously.  She has a friend who currently takes Effexor and is interested in switching.  She is also noticed that she constantly feels hot and sweaty and feels like it could be a side effect from the medication.  She still having night sweats as well.  She also reports that she has had some chronic nasal congestion for at least a couple of months she can usually completely clear it out but then towards the end of the day it starts to build back up again she has had some sinus pressure and tenderness over the nasal bridge on and off but no fevers chills or sweats.  Persistent headaches she says and nighttime allergy pill does seem to help.  She has been getting some green nasal discharge.  She also gets a lot of itching in her ears and has tried lots of things over-the-counter.  The only thing that seems to have helped is the efinaconazole drops and she would like a refill on that.  Social History   Socioeconomic History   Marital status: Married    Spouse name: Not on file   Number of children: 3   Years of education: Not on file   Highest education level: Not on file  Occupational History   Occupation: [employer]  Tobacco Use   Smoking status: Never   Smokeless tobacco: Never  Vaping Use   Vaping Use: Never used  Substance and Sexual Activity   Alcohol use: Yes    Alcohol/week: 0.0 - 1.0 standard drinks    Comment: occ 1 glass of wine or liquor   Drug use: No   Sexual activity: Yes    Partners: Male    Birth control/protection: Post-menopausal  Other Topics Concern   Not on file  Social History Narrative   No regular exercise due to foot surgery. 1 cup of coffee a day     Social Determinants of Health   Financial Resource Strain: Not on file  Food  Insecurity: Not on file  Transportation Needs: Not on file  Physical Activity: Not on file  Stress: Not on file  Social Connections: Not on file  Intimate Partner Violence: Not on file   Health Maintenance  Topic Date Due   PAP SMEAR-Modifier  02/12/2022   COLONOSCOPY (Pts 45-45yrs Insurance coverage will need to be confirmed)  02/10/2023   MAMMOGRAM  02/22/2023   TETANUS/TDAP  10/08/2027   INFLUENZA VACCINE  Completed   COVID-19 Vaccine  Completed   Hepatitis C Screening  Completed   HIV Screening  Completed   Zoster Vaccines- Shingrix  Completed   HPV VACCINES  Aged Out    The following portions of the patient's history were reviewed and updated as appropriate: allergies, current medications, past family history, past medical history, past social history, past surgical history, and problem list.  Review of Systems Pertinent items are noted in HPI.   Objective:    BP 126/61    Pulse (!) 55    Resp 18    Ht 5\' 6"  (1.676 m)    Wt 187 lb (84.8 kg)    LMP 11/11/2013    SpO2 96%    BMI 30.18 kg/m  General appearance: alert, cooperative, and appears stated age Head: Normocephalic,  without obvious abnormality, atraumatic Eyes:  conj clear, EOMI, PEERLA Ears: normal TM's and external ear canals both ears Nose: Nares normal. Septum midline. Mucosa normal. No drainage or sinus tenderness. Throat: lips, mucosa, and tongue normal; teeth and gums normal Neck: no adenopathy, no carotid bruit, no JVD, supple, symmetrical, trachea midline, and thyroid not enlarged, symmetric, no tenderness/mass/nodules Back: symmetric, no curvature. ROM normal. No CVA tenderness. Lungs: clear to auscultation bilaterally Breasts: normal appearance, no masses or tenderness Heart: regular rate and rhythm, S1, S2 normal, no murmur, click, rub or gallop Abdomen: soft, non-tender; bowel sounds normal; no masses,  no organomegaly Pelvic: cervix normal in appearance, external genitalia normal, no adnexal masses or  tenderness, no cervical motion tenderness, rectovaginal septum normal, uterus normal size, shape, and consistency, and vagina normal without discharge Extremities: extremities normal, atraumatic, no cyanosis or edema Pulses: 2+ and symmetric Skin: Skin color, texture, turgor normal. No rashes or lesions Lymph nodes: Cervical, supraclavicular, and axillary nodes normal. Neurologic: Grossly normal    Assessment:    Healthy female exam.      Plan:     See After Visit Summary for Counseling Recommendations  Keep up a regular exercise program and make sure you are eating a healthy diet Try to eat 4 servings of dairy a day, or if you are lactose intolerant take a calcium with vitamin D daily.  Your vaccines are up to date.  We will get up-to-date lab work today.  Situational stress Discussed options.  We will taper off of citalopram and switch to Effexor follow-up in 6 weeks to see how she is doing with the new medication and make adjustments to the regimen as needed  Acute sinusitis-we will treat with Augmentin.  We will go ahead and do a full 10-day course since her symptoms have been going on for couple of months.  Recommend nasal saline as well as running a humidifier  Itchy ear-most consistent with a form of eczema.  We will send over prescription for Derm otic oil.  Orders Placed This Encounter  Procedures   Lipid Panel w/reflex Direct LDL   COMPLETE METABOLIC PANEL WITH GFR   CBC   Meds ordered this encounter  Medications   Efinaconazole 10 % SOLN    Sig: Apply 1 drop topically daily.    Dispense:  4 mL    Refill:  2   diclofenac Sodium (VOLTAREN) 1 % GEL    Sig: Apply 4 g topically 4 (four) times daily.    Dispense:  400 g    Refill:  3   citalopram (CELEXA) 10 MG tablet    Sig: Take 1 tablet (10 mg total) by mouth daily for 8 days, THEN 0.5 tablets (5 mg total) daily for 8 days.    Dispense:  12 tablet    Refill:  0   DISCONTD: venlafaxine XR (EFFEXOR XR) 37.5 MG 24  hr capsule    Sig: Take 1 capsule (37.5 mg total) by mouth daily with breakfast for 8 days, THEN 2 capsules (75 mg total) daily with breakfast for 22 days.    Dispense:  52 capsule    Refill:  0   amoxicillin-clavulanate (AUGMENTIN) 875-125 MG tablet    Sig: Take 1 tablet by mouth 2 (two) times daily.    Dispense:  20 tablet    Refill:  0   Fluocinolone Acetonide 0.01 % OIL    Sig: Place 2 drops in ear(s) daily as needed.    Dispense:  20  mL    Refill:  0

## 2021-08-20 NOTE — Telephone Encounter (Signed)
Please call patient and let her know that I sent over Derm otic oil for the itchy ears for her to use.  The Jublia which is the other 1 which is an antifungal is really meant for toenails.  Plus it was not covered by insurance.  Also just wanted let her know that I did have to send over 2 different prescriptions for the Effexor they would not let me write a taper so they had to be individualized per the insurance company.  So just make sure she starts with the 37.5 mg bottle first and then once complete switches to the 75 mg bottle

## 2021-08-20 NOTE — Assessment & Plan Note (Signed)
Discussed options.  We will taper off of citalopram and switch to Effexor follow-up in 6 weeks to see how she is doing with the new medication and make adjustments to the regimen as needed

## 2021-08-21 ENCOUNTER — Telehealth: Payer: Self-pay

## 2021-08-21 LAB — CYTOLOGY - PAP
Comment: NEGATIVE
Diagnosis: NEGATIVE
High risk HPV: NEGATIVE

## 2021-08-21 NOTE — Progress Notes (Signed)
Your Pap smear is normal. You are negative for HPV as well. Repeat pap smear in 5 years.

## 2021-08-21 NOTE — Telephone Encounter (Signed)
Patient advised of message and verbalized understanding.  

## 2021-08-21 NOTE — Telephone Encounter (Signed)
Medication: diclofenac Sodium (VOLTAREN) 1 % GEL Prior authorization submitted via CoverMyMeds on 08/21/2021 [place] submission pending

## 2021-08-21 NOTE — Progress Notes (Signed)
Hi Della, overall labs look pretty good.

## 2021-08-21 NOTE — Telephone Encounter (Signed)
Left message for patient to return call.

## 2021-09-04 ENCOUNTER — Telehealth: Payer: Self-pay

## 2021-09-04 NOTE — Telephone Encounter (Addendum)
Initiated Prior authorization [id number] Sodium 1% gel Via: prompt pa Case: [id number] Status: approved as of 09/04/21 Reason: Notified Pt via: Mychart

## 2021-09-12 ENCOUNTER — Other Ambulatory Visit: Payer: Self-pay | Admitting: Family Medicine

## 2021-10-01 ENCOUNTER — Ambulatory Visit (INDEPENDENT_AMBULATORY_CARE_PROVIDER_SITE_OTHER): Payer: 59

## 2021-10-01 ENCOUNTER — Other Ambulatory Visit: Payer: Self-pay

## 2021-10-01 ENCOUNTER — Ambulatory Visit: Payer: 59 | Admitting: Family Medicine

## 2021-10-01 VITALS — BP 141/79 | HR 82 | Resp 18 | Ht 66.0 in | Wt 187.0 lb

## 2021-10-01 DIAGNOSIS — R030 Elevated blood-pressure reading, without diagnosis of hypertension: Secondary | ICD-10-CM

## 2021-10-01 DIAGNOSIS — M79645 Pain in left finger(s): Secondary | ICD-10-CM

## 2021-10-01 DIAGNOSIS — F439 Reaction to severe stress, unspecified: Secondary | ICD-10-CM | POA: Diagnosis not present

## 2021-10-01 MED ORDER — ESCITALOPRAM OXALATE 10 MG PO TABS: ORAL_TABLET | ORAL | 1 refills | Status: DC

## 2021-10-01 MED ORDER — VENLAFAXINE HCL ER 37.5 MG PO CP24: 37.5000 mg | ORAL_CAPSULE | Freq: Every day | ORAL | 0 refills | Status: DC

## 2021-10-01 NOTE — Progress Notes (Signed)
? ?Established Patient Office Visit ? ?Subjective:  ?Patient ID: Tonya Banks, female    DOB: 1961-06-30  Age: 61 y.o. MRN: [id number] ? ?CC:  ?Chief Complaint  ?Patient presents with  ? Discuss Medication  ?  Effexor follow up. Patient stated she full/bloated on Effexor and is having joint/left thumb pain and popping.   ? ? ?HPI ?Tonya Banks presents for  ? ?Effexor follow up. Patient stated she full/bloated on Effexor and is having joint/left thumb pain and popping in her left thumb.  She says it started around the time that she started the Effexor.  She says she feels hungry on it but then when she eats a little bit that she does not want to eat but then she feels hungry again.  She feels like it is caused her to gain some weight. ? ?Previously on citalopram.  He had discontinued it because she felt like it was no longer effective. ? ? ?She also complains of some left thumb pain at the base of the thumb at the PIP joint.  She says it is very tender and she is noticing that when she tries to flex her thumb that it is popping/clicking.  No specific injury or trauma but she would like to have further evaluation. ? ?No past medical history on file. ? ?Past Surgical History:  ?Procedure Laterality Date  ? APPENDECTOMY    ? BREAST BIOPSY Left   ? stereo 2017  ? breast lift  2011  ? FOOT SURGERY Left   ? Planter Fancitis  ? REDUCTION MAMMAPLASTY Bilateral 1992  ? TUBAL LIGATION    ? tummy tuck    ? uterine ablation  2000  ? ? ?Family History  ?Problem Relation Age of Onset  ? Hypertension Mother   ? Cancer Father   ?     mesothelioma  ? Gout Father   ? Alcoholism Brother   ? ? ?Social History  ? ?Socioeconomic History  ? Marital status: Married  ?  Spouse name: Not on file  ? Number of children: 3  ? Years of education: Not on file  ? Highest education level: Not on file  ?Occupational History  ? Occupation: [employer]  ?Tobacco Use  ? Smoking status: Never  ? Smokeless tobacco: Never  ?Vaping Use  ?  Vaping Use: Never used  ?Substance and Sexual Activity  ? Alcohol use: Yes  ?  Alcohol/week: 0.0 - 1.0 standard drinks  ?  Comment: occ 1 glass of wine or liquor  ? Drug use: No  ? Sexual activity: Yes  ?  Partners: Male  ?  Birth control/protection: Post-menopausal  ?Other Topics Concern  ? Not on file  ?Social History Narrative  ? No regular exercise due to foot surgery. 1 cup of coffee a day    ? ?Social Determinants of Health  ? ?Financial Resource Strain: Not on file  ?Food Insecurity: Not on file  ?Transportation Needs: Not on file  ?Physical Activity: Not on file  ?Stress: Not on file  ?Social Connections: Not on file  ?Intimate Partner Violence: Not on file  ? ? ?Outpatient Medications Prior to Visit  ?Medication Sig Dispense Refill  ? Coenzyme Q10 (CO Q 10 PO) Take 1 tablet by mouth daily.    ? cyclobenzaprine (FLEXERIL) 10 MG tablet Take 1 tablet (10 mg total) by mouth at bedtime as needed for muscle spasms. 90 tablet 0  ? diclofenac Sodium (VOLTAREN) 1 % GEL Apply 4 g  topically 4 (four) times daily. 400 g 3  ? Efinaconazole 10 % SOLN Apply 1 drop topically daily. 4 mL 2  ? fish oil-omega-3 fatty acids 1000 MG capsule Take 2 g by mouth daily.    ? Fluocinolone Acetonide 0.01 % OIL Place 2 drops in ear(s) daily as needed. 20 mL 0  ? gabapentin (NEURONTIN) 100 MG capsule TAKE 1-3 CAPSULES (100-300 MG TOTAL) BY MOUTH AT BEDTIME. 90 capsule 11  ? lansoprazole (PREVACID) 30 MG capsule Take 1 capsule (30 mg total) by mouth 2 (two) times daily before a meal. 60 capsule 1  ? Prenatal Vit-Fe Fumarate-FA (MULTIVITAMIN-PRENATAL) 27-0.8 MG TABS tablet Take 1 tablet by mouth daily at 12 noon.    ? simvastatin (ZOCOR) 20 MG tablet TAKE 1 TABLET BY MOUTH EVERYDAY AT BEDTIME 30 tablet 5  ? Turmeric 500 MG TABS Take 1,000 mg by mouth daily.    ? valACYclovir (VALTREX) 1000 MG tablet Take orally BID for a week prn 14 tablet 6  ? venlafaxine XR (EFFEXOR XR) 37.5 MG 24 hr capsule Take 1 capsule (37.5 mg total) by mouth daily  with breakfast. 10 capsule 0  ? venlafaxine XR (EFFEXOR XR) 75 MG 24 hr capsule Take 1 capsule (75 mg total) by mouth daily with breakfast. 30 capsule 0  ? citalopram (CELEXA) 10 MG tablet Take 1 tablet (10 mg total) by mouth daily for 8 days, THEN 0.5 tablets (5 mg total) daily for 8 days. 12 tablet 0  ? amoxicillin-clavulanate (AUGMENTIN) 875-125 MG tablet Take 1 tablet by mouth 2 (two) times daily. 20 tablet 0  ? Cholecalciferol (VITAMIN D PO) Take by mouth.    ? vitamin C (ASCORBIC ACID) 500 MG tablet Take 500 mg by mouth daily.    ? ?No facility-administered medications prior to visit.  ? ? ?Allergies  ?Allergen Reactions  ? Dexilant [Dexlansoprazole]   ?  Sharp pains on stomach  ? Meloxicam   ?  Pt stated, "This upsets my stomach"  ? Pravastatin Other (See Comments)  ?  msucle aches ?  ? Sulfonamide Derivatives Other (See Comments)  ?  Stomach pain.   ? ? ?ROS ?Review of Systems ? ?  ?Objective:  ?  ?Physical Exam ? ?BP (!) 141/79 (BP Location: Right Arm)   Pulse 82   Resp 18   Ht '5\' 6"'  (1.676 m)   Wt 187 lb (84.8 kg)   LMP 11/11/2013   SpO2 98%   BMI 30.18 kg/m?  ?Wt Readings from Last 3 Encounters:  ?10/01/21 187 lb (84.8 kg)  ?08/20/21 187 lb (84.8 kg)  ?11/01/20 189 lb (85.7 kg)  ? ? ? ?There are no preventive care reminders to display for this patient. ? ?There are no preventive care reminders to display for this patient. ? ?Lab Results  ?Component Value Date  ? TSH 1.26 12/17/2018  ? ?Lab Results  ?Component Value Date  ? WBC 4.6 08/20/2021  ? HGB 14.1 08/20/2021  ? HCT 41.8 08/20/2021  ? MCV 93.5 08/20/2021  ? PLT 241 08/20/2021  ? ?Lab Results  ?Component Value Date  ? NA 141 08/20/2021  ? K 4.3 08/20/2021  ? CO2 33 (H) 08/20/2021  ? GLUCOSE 100 (H) 08/20/2021  ? BUN 13 08/20/2021  ? CREATININE 0.69 08/20/2021  ? BILITOT 0.7 08/20/2021  ? ALKPHOS 91 02/12/2017  ? AST 17 08/20/2021  ? ALT 16 08/20/2021  ? PROT 6.9 08/20/2021  ? ALBUMIN 4.6 02/12/2017  ? CALCIUM 9.6 08/20/2021  ?  EGFR 100  08/20/2021  ? ?Lab Results  ?Component Value Date  ? CHOL 175 08/20/2021  ? ?Lab Results  ?Component Value Date  ? HDL 53 08/20/2021  ? ?Lab Results  ?Component Value Date  ? [place] 99 08/20/2021  ? ?Lab Results  ?Component Value Date  ? TRIG 130 08/20/2021  ? ?Lab Results  ?Component Value Date  ? CHOLHDL 3.3 08/20/2021  ? ?Lab Results  ?Component Value Date  ? HGBA1C 5.5 11/01/2020  ? ? ?  ?Assessment & Plan:  ? ?Problem List Items Addressed This Visit   ? ?  ? Other  ? Situational stress  ?  We discussed options at this point she would prefer to go back on the citalopram.  So we discussed maybe trying Lexapro which is escitalopram.  We will taper down on the Effexor to 37.5 mg for about 5 to 6 days and then go ahead and switch to the Cipro she can start with 5 mg daily for 8 days and then go up to a whole tab.  I like to see her back in about 2 months to see if she feels like she is getting back on track and doing well. ?  ?  ? ?Other Visit Diagnoses   ? ? Pain of left thumb    -  Primary  ? Relevant Orders  ? DG Finger Thumb Left  ? Elevated BP without diagnosis of hypertension      ? ?  ? ? ?Left thumb with some tenderness at the PIP joint.  She definitely has some popping at the DIP joint.  Possible triggering.  We discussed getting a plain film x-ray for follow-up and then refer to sports medicine for further evaluation and to discuss treatment options I really do not think this is related at all to switching to the Effexor.  She is right-handed. ? ?Blood pressure was elevated today.  Will recheck again when I see her back in about 8 weeks.  It looked great previously. ? ?Meds ordered this encounter  ?Medications  ? venlafaxine XR (EFFEXOR XR) 37.5 MG 24 hr capsule  ?  Sig: Take 1 capsule (37.5 mg total) by mouth daily with breakfast for 8 days.  ?  Dispense:  8 capsule  ?  Refill:  0  ? escitalopram (LEXAPRO) 10 MG tablet  ?  Sig: Take 0.5 tablets (5 mg total) by mouth daily for 8 days, THEN 1 tablet (10 mg  total) daily for 22 days.  ?  Dispense:  30 tablet  ?  Refill:  1  ? ? ?Follow-up: Return in about 2 months (around 12/01/2021) for change in medication .  ? ? ?Beatrice Lecher, MD ?

## 2021-10-01 NOTE — Assessment & Plan Note (Signed)
We discussed options at this point she would prefer to go back on the citalopram.  So we discussed maybe trying Lexapro which is escitalopram.  We will taper down on the Effexor to 37.5 mg for about 5 to 6 days and then go ahead and switch to the Cipro she can start with 5 mg daily for 8 days and then go up to a whole tab.  I like to see her back in about 2 months to see if she feels like she is getting back on track and doing well. ?

## 2021-10-04 NOTE — Progress Notes (Signed)
Hi Tonya Banks, x-ray actually looks okay as far as no sign of fracture or dislocation.  No severe arthritis.  I definitely like to get in with Dr. Darene Lamer to take a look at that thumb.

## 2021-10-09 ENCOUNTER — Ambulatory Visit (INDEPENDENT_AMBULATORY_CARE_PROVIDER_SITE_OTHER): Payer: 59 | Admitting: Sports Medicine

## 2021-10-09 ENCOUNTER — Ambulatory Visit (INDEPENDENT_AMBULATORY_CARE_PROVIDER_SITE_OTHER): Payer: 59

## 2021-10-09 ENCOUNTER — Other Ambulatory Visit: Payer: Self-pay

## 2021-10-09 DIAGNOSIS — M65312 Trigger thumb, left thumb: Secondary | ICD-10-CM | POA: Diagnosis not present

## 2021-10-09 NOTE — Assessment & Plan Note (Signed)
Pleasant 61 year old female, she has had months of pain left thumb volar aspect with triggering, she has tried Mobic, conditioning, bracing without much improvement. ?On exam she has visible and palpable triggering, today I performed an injection into the flexor pollicis longus tendon sheath, adding trigger thumb conditioning, return to see me back in 6 weeks. ?

## 2021-10-09 NOTE — Progress Notes (Signed)
? ? ?  Procedures performed today:   ? ?Procedure: Real-time Ultrasound Guided injection of the left flexor pollicis longus tendon sheath ?Device: Samsung HS60  ?Verbal informed consent obtained.  ?Time-out conducted.  ?Noted no overlying erythema, induration, or other signs of local infection.  ?Skin prepped in a sterile fashion.  ?Local anesthesia: Topical Ethyl chloride.  ?With sterile technique and under real time ultrasound guidance: Noted flexor tendon nodule, 1/2 cc lidocaine, 1/2 cc kenalog 40 injected easily. ?Completed without difficulty  ?Advised to call if fevers/chills, erythema, induration, drainage, or persistent bleeding.  ?Images permanently stored and available for review in PACS.  ?Impression: Technically successful ultrasound guided injection. ? ?Independent interpretation of notes and tests performed by another provider:  ? ?None. ? ?Brief History, Exam, Impression, and Recommendations:   ? ?Trigger thumb, left thumb ?Pleasant 61 year old female, she has had months of pain left thumb volar aspect with triggering, she has tried Mobic, conditioning, bracing without much improvement. ?On exam she has visible and palpable triggering, today I performed an injection into the flexor pollicis longus tendon sheath, adding trigger thumb conditioning, return to see me back in 6 weeks. ? ? ? ?___________________________________________ ?Gwen Her. Dianah Field, M.D., ABFM., CAQSM. ?Primary Care and Sports Medicine ?[place] ? ?Adjunct Instructor of Family Medicine  ?[employer] ?

## 2021-10-23 ENCOUNTER — Other Ambulatory Visit: Payer: Self-pay | Admitting: Family Medicine

## 2021-10-23 DIAGNOSIS — E7800 Pure hypercholesterolemia, unspecified: Secondary | ICD-10-CM

## 2021-11-09 ENCOUNTER — Other Ambulatory Visit: Payer: Self-pay | Admitting: Family Medicine

## 2021-11-11 ENCOUNTER — Other Ambulatory Visit: Payer: Self-pay | Admitting: *Deleted

## 2022-01-19 ENCOUNTER — Other Ambulatory Visit: Payer: Self-pay | Admitting: Family Medicine

## 2022-01-19 DIAGNOSIS — E7800 Pure hypercholesterolemia, unspecified: Secondary | ICD-10-CM

## 2022-01-26 ENCOUNTER — Other Ambulatory Visit: Payer: Self-pay | Admitting: Family Medicine

## 2022-01-30 ENCOUNTER — Ambulatory Visit: Payer: 59 | Admitting: Family Medicine

## 2022-01-30 ENCOUNTER — Encounter: Payer: Self-pay | Admitting: Family Medicine

## 2022-01-30 VITALS — BP 157/61 | HR 59 | Ht 66.0 in | Wt 199.0 lb

## 2022-01-30 DIAGNOSIS — R102 Pelvic and perineal pain: Secondary | ICD-10-CM | POA: Diagnosis not present

## 2022-01-30 DIAGNOSIS — I10 Essential (primary) hypertension: Secondary | ICD-10-CM | POA: Diagnosis not present

## 2022-01-30 DIAGNOSIS — R42 Dizziness and giddiness: Secondary | ICD-10-CM | POA: Insufficient documentation

## 2022-01-30 HISTORY — DX: Essential (primary) hypertension: I10

## 2022-01-30 LAB — POCT URINALYSIS DIP (CLINITEK)
Bilirubin, UA: NEGATIVE
Glucose, UA: NEGATIVE mg/dL
Ketones, POC UA: NEGATIVE mg/dL
Leukocytes, UA: NEGATIVE
Nitrite, UA: NEGATIVE
POC PROTEIN,UA: NEGATIVE
Spec Grav, UA: 1.01 (ref 1.010–1.025)
Urobilinogen, UA: 1 E.U./dL
pH, UA: 6.5 (ref 5.0–8.0)

## 2022-01-30 MED ORDER — OLMESARTAN MEDOXOMIL 5 MG PO TABS: 5.0000 mg | ORAL_TABLET | Freq: Every day | ORAL | 0 refills | Status: DC

## 2022-01-30 NOTE — Addendum Note (Signed)
Addended by: Teddy Spike on: 01/30/2022 05:03 PM   Modules accepted: Orders

## 2022-01-30 NOTE — Progress Notes (Signed)
Acute Office Visit  Subjective:     Patient ID: Tonya Banks, female    DOB: 24-Nov-1960, 61 y.o.   MRN: [id number]  Chief Complaint  Patient presents with   Hypertension   Dizziness    HPI Patient is in today for dizziness that started about 4 days ago.  She says it almost feels like when she is on a boat or a ship.  It [place] feels like it something behind her eyes.  She has not had any ear pain no recent sinus congestion or upper respiratory symptoms.  No unusual headaches.  She does not really get nauseated with it.  Usually if it happens she will just sit down for about 10 minutes and that usually eases off.  It is worse when she bends over.  She has not noticed it at night rolling over in bed.  Wonders if has a UTI. Started noticing some "twinges" with urination but started a probiotic.  She says the last day or 2 she has not felt it.  She has noticed a little burning sensation while sitting but not with urination.  No abnormal vaginal discharge.  ROS      Objective:    BP (!) 157/61   Pulse (!) 59   Ht '5\' 6"'$  (1.676 m)   Wt 199 lb (90.3 kg)   LMP 11/11/2013   SpO2 99%   BMI 32.12 kg/m    Physical Exam Constitutional:      Appearance: She is well-developed.  HENT:     Head: Normocephalic and atraumatic.     Right Ear: Tympanic membrane, ear canal and external ear normal.     Left Ear: Tympanic membrane, ear canal and external ear normal.     Nose: Nose normal.  Eyes:     Conjunctiva/sclera: Conjunctivae normal.     Pupils: Pupils are equal, round, and reactive to light.  Neck:     Thyroid: No thyromegaly.  Cardiovascular:     Rate and Rhythm: Normal rate and regular rhythm.     Heart sounds: Normal heart sounds.  Pulmonary:     Effort: Pulmonary effort is normal.     Breath sounds: Normal breath sounds. No wheezing.  Musculoskeletal:     Cervical back: Neck supple.  Lymphadenopathy:     Cervical: No cervical adenopathy.  Skin:    General: Skin is  warm and dry.  Neurological:     Mental Status: She is alert and oriented to person, place, and time.     Comments: She did experience the dizziness with Dix-Hallpike maneuver to the right but no nystagmus on exam.     No results found for any visits on 01/30/22.      Assessment & Plan:   Problem List Items Addressed This Visit       Cardiovascular and Mediastinum   Hypertension    We discussed starting a new blood pressure medication.  She is really been try to control her blood pressure for a couple of years now but its been persistently elevated occasionally she will get a really good reading.  We will start with an ARB.  Follow-up in 2 weeks with nurse visit and BMP and repeat blood pressure check.      Relevant Medications   olmesartan (BENICAR) 5 MG tablet   Other Relevant Orders   BASIC METABOLIC PANEL WITH GFR     Other   Dizziness - Primary    Suspect BPPV on the right side based  on exam though no nystagmus on exam.  Given handout on exercises to do on her own at home.  Can use nondrowsy Dramamine if needed as well.  If not improving after several days of home PT then please let us know.      Other Visit Diagnoses     Pelvic pressure in female          Pelvic pressure-urinalysis looks good and her symptoms have been a little bit better over the last couple of days we just discussed that if it recurs or worsens to please give [place] a call back.  Meds ordered this encounter  Medications   olmesartan (BENICAR) 5 MG tablet    Sig: Take 1 tablet (5 mg total) by mouth daily.    Dispense:  90 tablet    Refill:  0    Return in about 2 weeks (around 02/13/2022) for Hypertension, nurse visit for BP and go to lab for BMP.  Beatrice Lecher, MD

## 2022-01-30 NOTE — Assessment & Plan Note (Signed)
Suspect BPPV on the right side based on exam though no nystagmus on exam.  Given handout on exercises to do on her own at home.  Can use nondrowsy Dramamine if needed as well.  If not improving after several days of home PT then please let us know.

## 2022-01-30 NOTE — Assessment & Plan Note (Signed)
We discussed starting a new blood pressure medication.  She is really been try to control her blood pressure for a couple of years now but its been persistently elevated occasionally she will get a really good reading.  We will start with an ARB.  Follow-up in 2 weeks with nurse visit and BMP and repeat blood pressure check.

## 2022-02-14 ENCOUNTER — Ambulatory Visit (INDEPENDENT_AMBULATORY_CARE_PROVIDER_SITE_OTHER): Payer: 59 | Admitting: Family Medicine

## 2022-02-14 ENCOUNTER — Other Ambulatory Visit: Payer: Self-pay | Admitting: Family Medicine

## 2022-02-14 VITALS — BP 135/65 | HR 55

## 2022-02-14 DIAGNOSIS — I10 Essential (primary) hypertension: Secondary | ICD-10-CM | POA: Diagnosis not present

## 2022-02-14 DIAGNOSIS — Z1231 Encounter for screening mammogram for malignant neoplasm of breast: Secondary | ICD-10-CM

## 2022-02-14 NOTE — Progress Notes (Signed)
   Established Patient Office Visit  Subjective   Patient ID: Tonya Banks, female    DOB: [birth year]  Age: 61 y.o. MRN: [id number]  Chief Complaint  Patient presents with   Hypertension    HPI  Tonya Banks is here for blood pressure check. [name], shortness of breath or dizziness.   Home blood pressure readings have not been more than 125/68.  ROS    Objective:     BP 135/65   Pulse (!) 55   LMP 11/11/2013   SpO2 97%    Physical Exam   No results found for any visits on 02/14/22.    The 10-year ASCVD risk score (Arnett DK, et al., 2019) is: 4.1%    Assessment & Plan:  Hypertension - Blood pressure within normal limits. Patient did have labs today. Patient advised to continue current medications and follow up in 3 months with Dr Madilyn Fireman.   Problem List Items Addressed This Visit       Unprioritized   Hypertension - Primary    Return in about 3 months (around 05/17/2022) for HTN with Dr Madilyn Fireman.Durene Romans, Monico Blitz, [place]

## 2022-02-14 NOTE — Progress Notes (Signed)
Agree with documentation as above.   Avrey Hyser, MD  

## 2022-02-15 LAB — BASIC METABOLIC PANEL WITH GFR
BUN: 12 mg/dL (ref 7–25)
CO2: 29 mmol/L (ref 20–32)
Calcium: 9.5 mg/dL (ref 8.6–10.4)
Chloride: 105 mmol/L (ref 98–110)
Creat: 0.67 mg/dL (ref 0.50–1.03)
Glucose, Bld: 101 mg/dL — ABNORMAL HIGH (ref 65–99)
Potassium: 3.9 mmol/L (ref 3.5–5.3)
Sodium: 141 mmol/L (ref 135–146)
eGFR: 101 mL/min/{1.73_m2} (ref >=60)

## 2022-02-17 NOTE — Progress Notes (Signed)
Your lab work is within acceptable range and there are no concerning findings.   ?

## 2022-02-26 ENCOUNTER — Ambulatory Visit (INDEPENDENT_AMBULATORY_CARE_PROVIDER_SITE_OTHER): Payer: 59

## 2022-02-26 DIAGNOSIS — Z1231 Encounter for screening mammogram for malignant neoplasm of breast: Secondary | ICD-10-CM | POA: Diagnosis not present

## 2022-02-28 NOTE — Progress Notes (Signed)
Please call patient. Normal mammogram.  Repeat in 1 year.  

## 2022-03-16 ENCOUNTER — Other Ambulatory Visit: Payer: Self-pay | Admitting: Family Medicine

## 2022-03-20 IMAGING — MG DIGITAL SCREENING BILAT W/ TOMO W/ CAD
8 series · 8 of 24 positions shown · non-contrast
Comparison: Previous exam(s).

CLINICAL DATA: Screening.

EXAM:
DIGITAL SCREENING BILATERAL MAMMOGRAM WITH TOMO AND CAD

[R CC synth-2D]
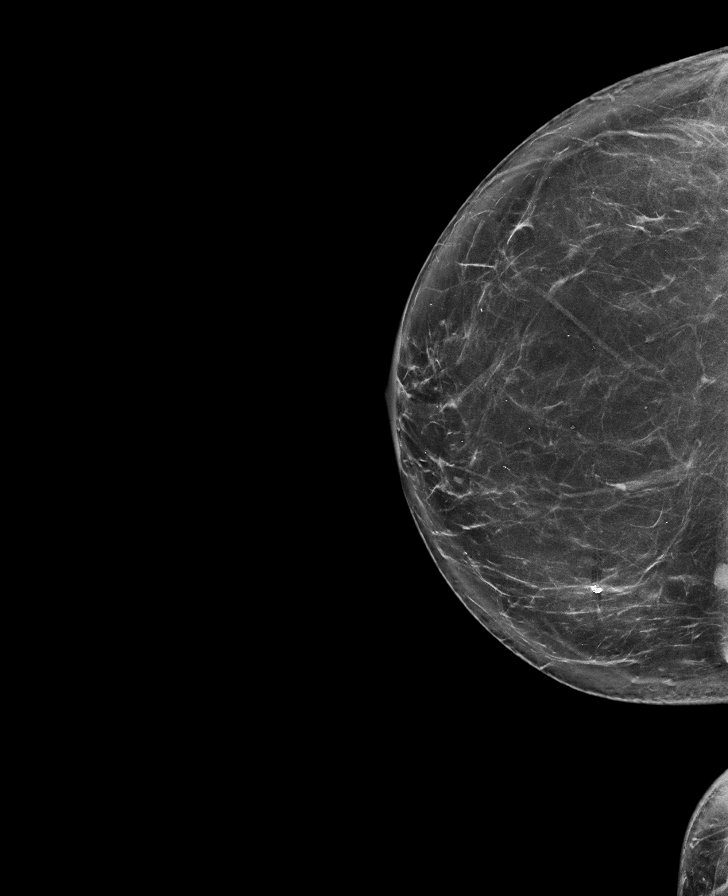

[R MLO synth-2D]
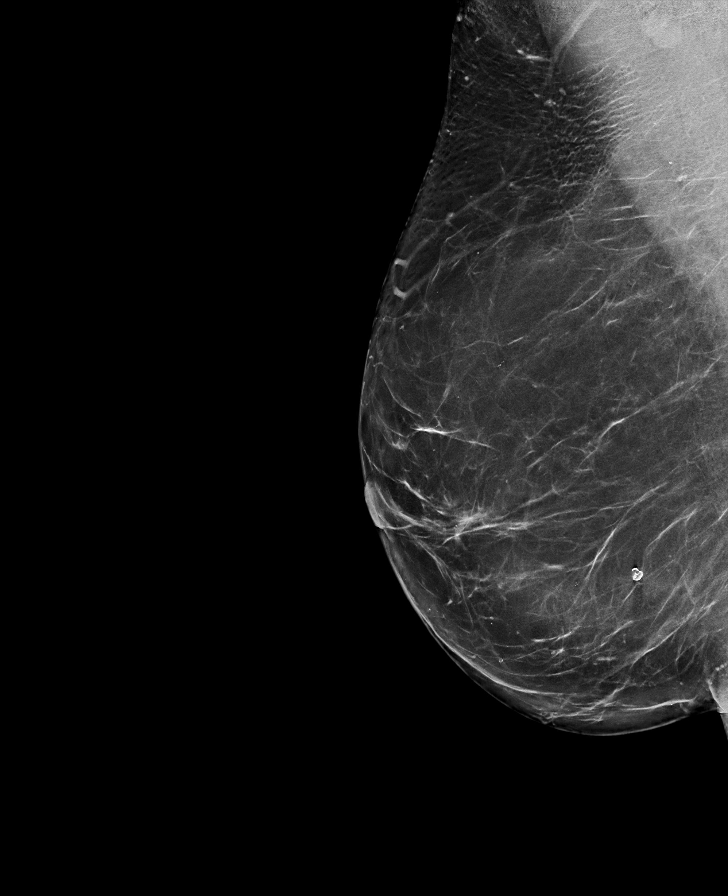

[L CC synth-2D]
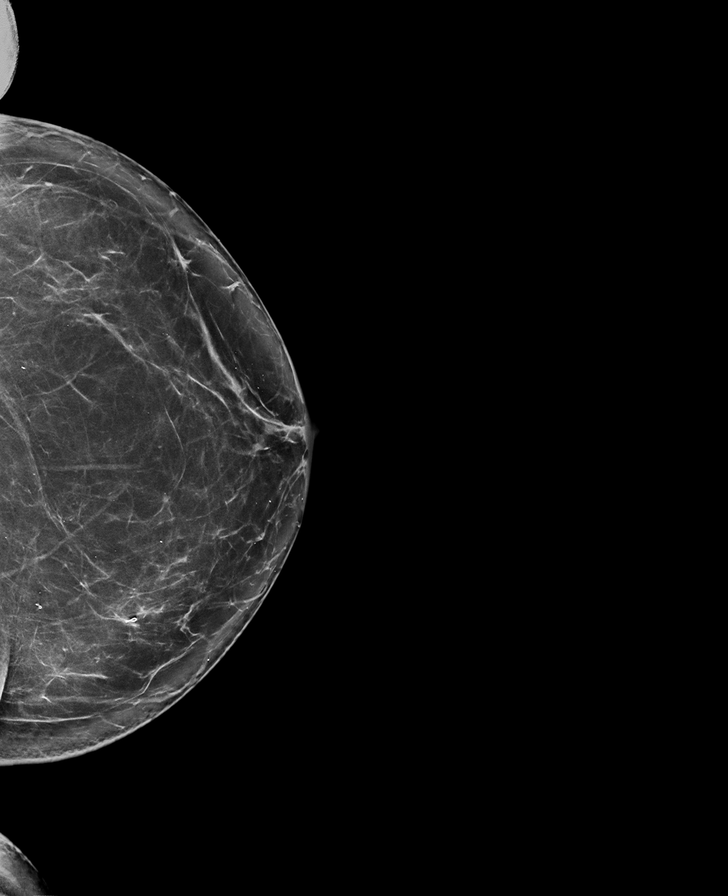

[L MLO synth-2D]
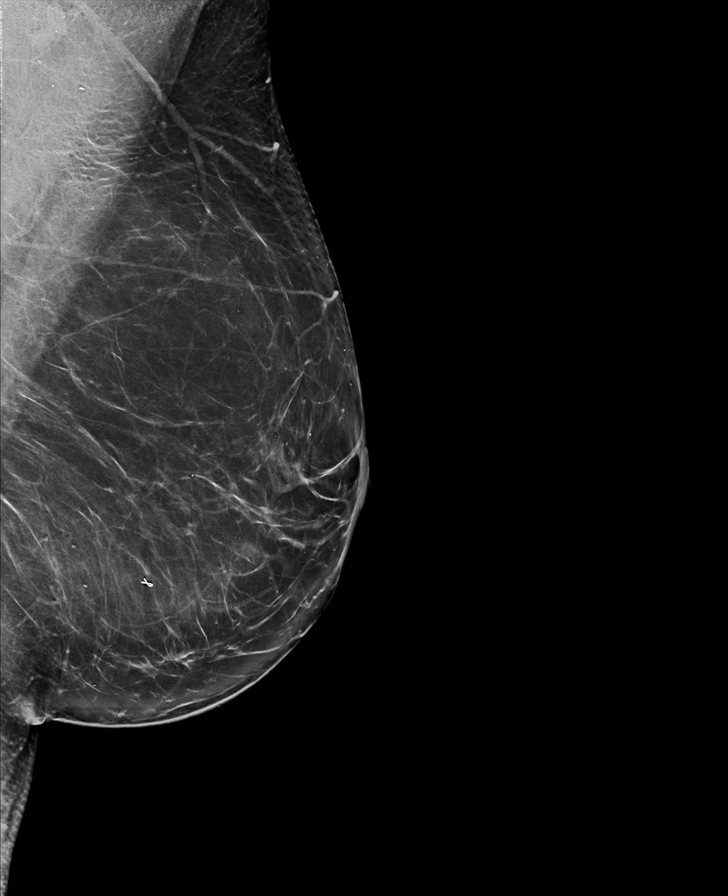

[R CC tomo · tomo slice 41/81.0]
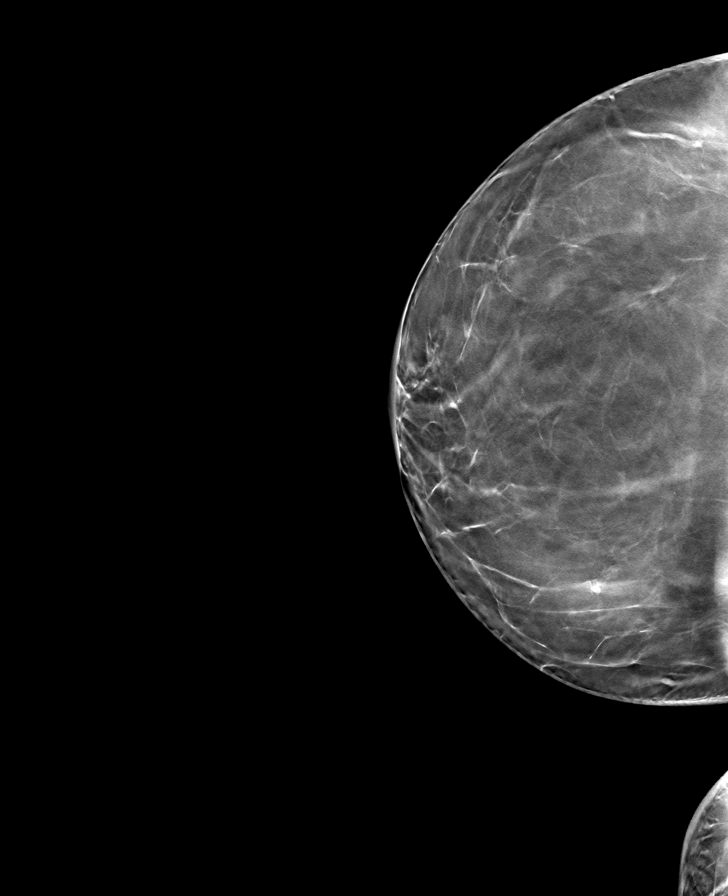

[L CC tomo · tomo slice 42/83.0]
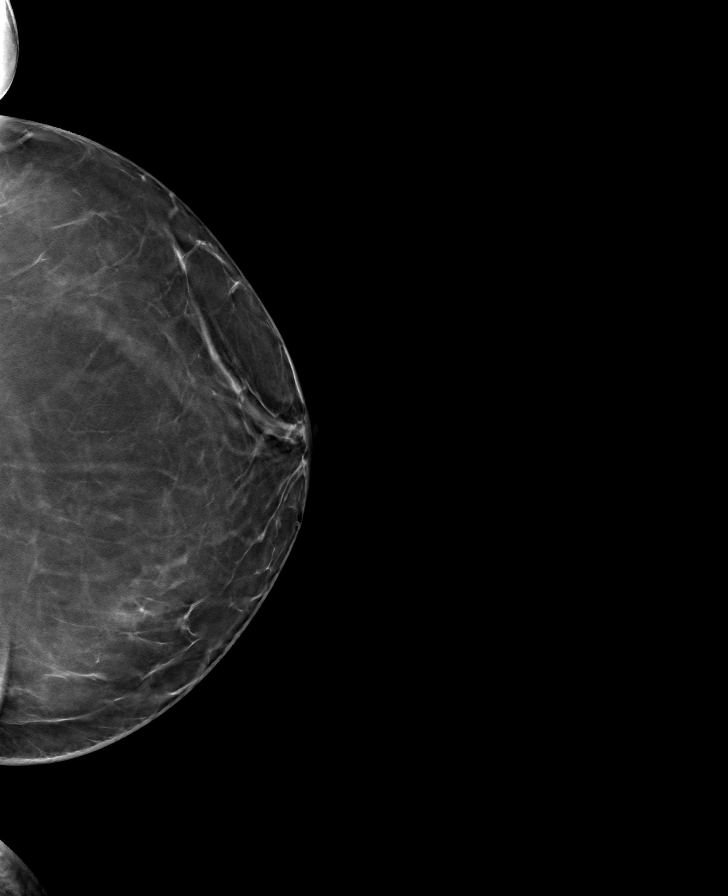

[R MLO tomo · tomo slice 43/86.0]
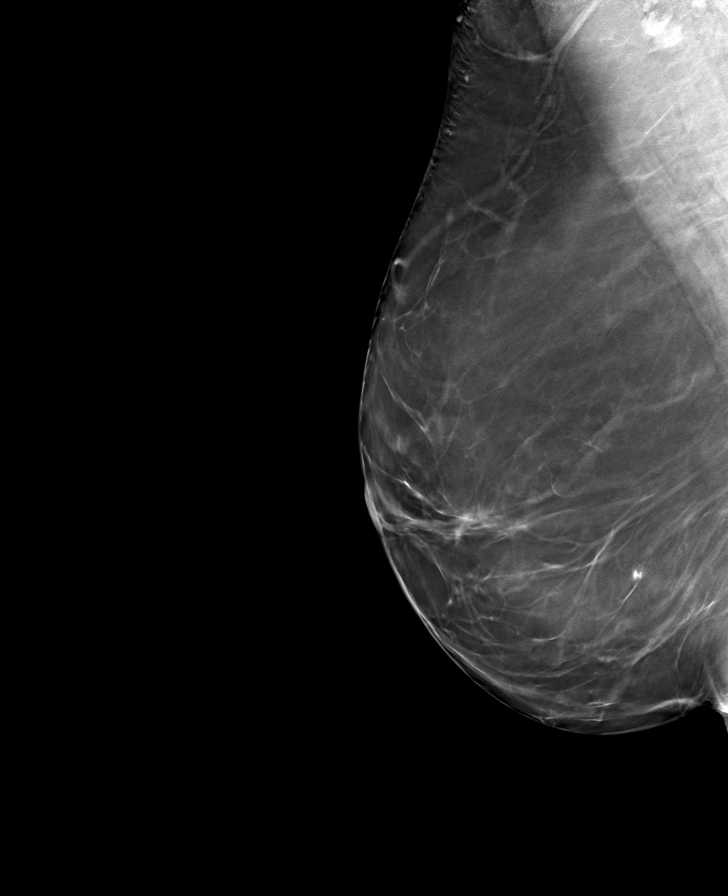

[L MLO tomo · tomo slice 44/87.0]
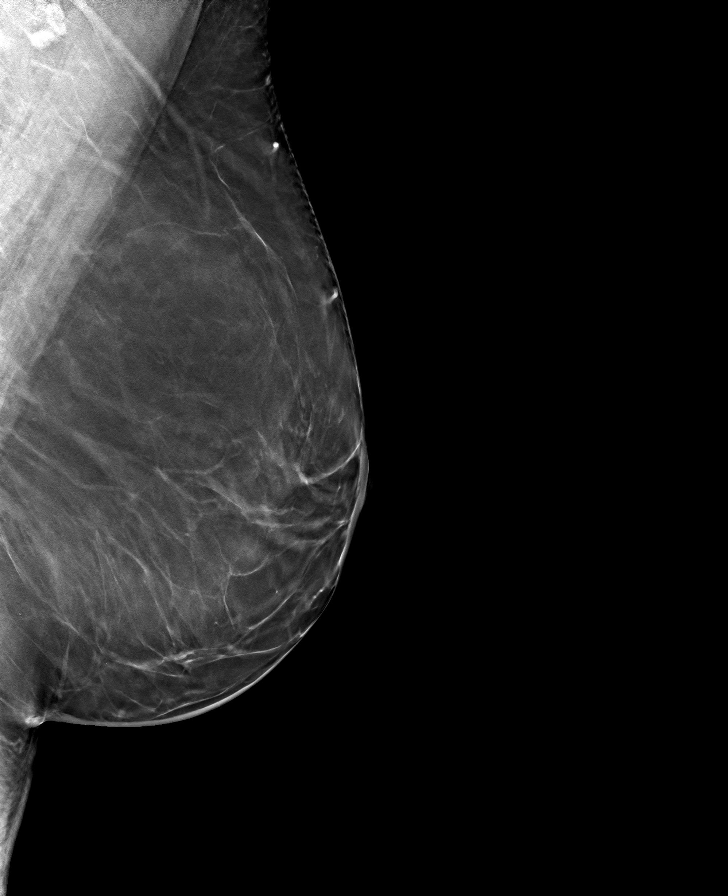

[8 of 24 positions shown; findings below may reference images not displayed]

ACR Breast Density Category b: There are scattered areas of
fibroglandular density.
FINDINGS: There are no findings suspicious for malignancy. Images were
processed with CAD.
IMPRESSION: No mammographic evidence of malignancy. A result letter of this
screening mammogram will be mailed directly to the patient.

RECOMMENDATION:
Screening mammogram in one year. (Code:[id number]-775)

BI-RADS CATEGORY  1: Negative.

## 2022-04-19 ENCOUNTER — Other Ambulatory Visit: Payer: Self-pay | Admitting: Family Medicine

## 2022-04-19 DIAGNOSIS — E7800 Pure hypercholesterolemia, unspecified: Secondary | ICD-10-CM

## 2022-04-22 ENCOUNTER — Other Ambulatory Visit: Payer: Self-pay | Admitting: Family Medicine

## 2022-04-26 ENCOUNTER — Other Ambulatory Visit: Payer: Self-pay | Admitting: Family Medicine

## 2022-04-27 ENCOUNTER — Other Ambulatory Visit: Payer: Self-pay | Admitting: Family Medicine

## 2022-05-22 ENCOUNTER — Encounter: Payer: Self-pay | Admitting: Family Medicine

## 2022-05-22 ENCOUNTER — Ambulatory Visit: Payer: 59 | Admitting: Family Medicine

## 2022-05-22 VITALS — BP 134/61 | HR 60 | Ht 66.0 in | Wt 197.0 lb

## 2022-05-22 DIAGNOSIS — F439 Reaction to severe stress, unspecified: Secondary | ICD-10-CM | POA: Diagnosis not present

## 2022-05-22 DIAGNOSIS — I10 Essential (primary) hypertension: Secondary | ICD-10-CM | POA: Diagnosis not present

## 2022-05-22 MED ORDER — ESCITALOPRAM OXALATE 20 MG PO TABS: 20.0000 mg | ORAL_TABLET | Freq: Every day | ORAL | 1 refills | Status: DC

## 2022-05-22 NOTE — Progress Notes (Signed)
Pt increased her Lexapro 10 mg to 20 mg 3 weeks ago because she had been more anxious. She had a lot of work, personal things going on.

## 2022-05-22 NOTE — Progress Notes (Signed)
Established Patient Office Visit  Subjective   Patient ID: Tonya Banks, female    DOB: 08-Sep-1960  Age: 61 y.o. MRN: [id number]  Chief Complaint  Patient presents with   Hypertension    HPI  Hypertension- Pt denies chest pain, SOB, dizziness, or heart palpitations.  Taking meds as directed w/o problems.  Denies medication side effects.   She did bring in her home blood pressure log and they looked fantastic up until she took cold medicine a couple of weeks ago but it has not come back down since then it has remained elevated.  She did go up on her Lexapro about 3 weeks ago.    She is also here to follow-up on new start Lexapro.  She actually taken citalopram in the past.  She says about 3 weeks ago she ended up bumping up to 20 mg.  She just has a lot going on and a lot of stressors.  Work is also been particularly stressful, they are changing to new computer system. .  She is also worried about her mom who is 53 and living in [place] by herself.  She and her husband are getting ready to go on vacation to [place] next week.  That is been on her bucket list for years.     05/22/2022    3:55 PM 08/20/2021    8:40 AM 11/01/2020    4:22 PM  PHQ9 SCORE ONLY  PHQ-9 Total Score 0 0 0      05/22/2022    3:54 PM 11/01/2020    4:21 PM 03/18/2019    2:18 PM 01/11/2019    2:18 PM  GAD 7 : Generalized Anxiety Score  Nervous, Anxious, on Edge '1 1 1 1  '$ Control/stop worrying 0 '1 1 1  '$ Worry too much - different things 0 '1 1 2  '$ Trouble relaxing 0 1 0 0  Restless 0 0 0 0  Easily annoyed or irritable 0 1 0 0  Afraid - awful might happen 0 0 0 0  Total GAD 7 Score '1 5 3 4  '$ Anxiety Difficulty Not difficult at all Not difficult at all Not difficult at all Not difficult at all        ROS    Objective:     BP 134/61   Pulse 60   Ht '5\' 6"'$  (1.676 m)   Wt 197 lb (89.4 kg)   LMP 11/11/2013   SpO2 97%   BMI 31.80 kg/m    Physical Exam Vitals and nursing note reviewed.   Constitutional:      Appearance: She is well-developed.  HENT:     Head: Normocephalic and atraumatic.  Cardiovascular:     Rate and Rhythm: Normal rate and regular rhythm.     Heart sounds: Normal heart sounds.  Pulmonary:     Effort: Pulmonary effort is normal.     Breath sounds: Normal breath sounds.  Skin:    General: Skin is warm and dry.  Neurological:     Mental Status: She is alert and oriented to person, place, and time.  Psychiatric:        Behavior: Behavior normal.      No results found for any visits on 05/22/22.    The 10-year ASCVD risk score (Arnett DK, et al., 2019) is: 4%    Assessment & Plan:   Problem List Items Addressed This Visit       Cardiovascular and Mediastinum   Hypertension - Primary  Blood pressure little borderline today.  She did bring in her home blood pressure log and they looked fantastic up until she took cold medicine a couple of weeks ago but it has not come back down since then it has remained elevated.  She did go up on her Lexapro about 3 weeks ago but I do not see hypertension as a side effect of the medication so the plan is to continue to monitor it for couple more weeks and see if it trends back down.  Continue to work on low-salt diet and regular exercise if not improving she will let me know.  Would consider going back down on the Lexapro temporarily if blood pressures are not improving.        Other   Situational stress    We will change Lexapro to 20 mg.  Could be contributing to high blood pressures but I do not see it always on the common side effect list.  We will continue to monitor for now.  She is otherwise doing well.  Follow-up in 3 to 4 months.      Relevant Medications   escitalopram (LEXAPRO) 20 MG tablet    Return in about 3 months (around 08/22/2022) for Hypertension.    Beatrice Lecher, MD

## 2022-05-22 NOTE — Assessment & Plan Note (Signed)
Blood pressure little borderline today.  She did bring in her home blood pressure log and they looked fantastic up until she took cold medicine a couple of weeks ago but it has not come back down since then it has remained elevated.  She did go up on her Lexapro about 3 weeks ago but I do not see hypertension as a side effect of the medication so the plan is to continue to monitor it for couple more weeks and see if it trends back down.  Continue to work on low-salt diet and regular exercise if not improving she will let me know.  Would consider going back down on the Lexapro temporarily if blood pressures are not improving.

## 2022-05-22 NOTE — Assessment & Plan Note (Signed)
We will change Lexapro to 20 mg.  Could be contributing to high blood pressures but I do not see it always on the common side effect list.  We will continue to monitor for now.  She is otherwise doing well.  Follow-up in 3 to 4 months.

## 2022-07-02 ENCOUNTER — Ambulatory Visit: Payer: 59 | Admitting: Family Medicine

## 2022-07-02 ENCOUNTER — Encounter: Payer: Self-pay | Admitting: Family Medicine

## 2022-07-02 VITALS — BP 135/66 | HR 62 | Ht 66.0 in | Wt 192.0 lb

## 2022-07-02 DIAGNOSIS — B379 Candidiasis, unspecified: Secondary | ICD-10-CM | POA: Diagnosis not present

## 2022-07-02 DIAGNOSIS — J019 Acute sinusitis, unspecified: Secondary | ICD-10-CM | POA: Insufficient documentation

## 2022-07-02 DIAGNOSIS — T3695XA Adverse effect of unspecified systemic antibiotic, initial encounter: Secondary | ICD-10-CM

## 2022-07-02 MED ORDER — DOXYCYCLINE HYCLATE 100 MG PO TABS: 100.0000 mg | ORAL_TABLET | Freq: Two times a day (BID) | ORAL | 0 refills | Status: AC

## 2022-07-02 MED ORDER — FLUCONAZOLE 150 MG PO TABS: ORAL_TABLET | ORAL | 0 refills | Status: DC

## 2022-07-02 NOTE — Progress Notes (Signed)
Acute Office Visit  Subjective:     Patient ID: Tonya Banks, female    DOB: 1961-05-11, 61 y.o.   MRN: [id number]  Chief Complaint  Patient presents with   Cough    HPI Patient is in today for cough, congestion, and otalgia. She said symptoms started about 1 month ago when she went to [place] with her husband and two sisters. Upon her return she got sick. She had similar symptoms of cough, congestion and felt like she got better however two weeks ago patient started to feel worse again with congestion, otalgia, and tooth pain. She has tried OTC medication that has not improved symptoms.   Review of Systems  Constitutional:  Negative for chills and fever.  HENT:  Positive for congestion and sinus pain.   Respiratory:  Negative for cough and shortness of breath.   Cardiovascular:  Negative for chest pain.  Neurological:  Negative for headaches.       Objective:    BP 135/66   Pulse 62   Ht '5\' 6"'$  (1.676 m)   Wt 192 lb (87.1 kg)   LMP 11/11/2013   SpO2 100%   BMI 30.99 kg/m    Physical Exam Vitals and nursing note reviewed.  Constitutional:      General: She is not in acute distress.    Appearance: Normal appearance.  HENT:     Head: Normocephalic and atraumatic.     Comments: Tenderness to palpation of maxillary sinuses bilaterally. Non-tender to frontal sinuses bilaterally    Right Ear: Tympanic membrane, ear canal and external ear normal.     Left Ear: Tympanic membrane and external ear normal.     Ears:     Comments: Erythema of left ear canal with trauma to skin    Nose: Nose normal.     Mouth/Throat:     Pharynx: Posterior oropharyngeal erythema present.  Eyes:     Conjunctiva/sclera: Conjunctivae normal.  Cardiovascular:     Rate and Rhythm: Normal rate and regular rhythm.  Pulmonary:     Effort: Pulmonary effort is normal.     Breath sounds: Normal breath sounds.  Neurological:     General: No focal deficit present.     Mental Status: She is  alert and oriented to person, place, and time.  Psychiatric:        Mood and Affect: Mood normal.        Behavior: Behavior normal.        Thought Content: Thought content normal.        Judgment: Judgment normal.     No results found for any visits on 07/02/22.      Assessment & Plan:   Problem List Items Addressed This Visit       Respiratory   Acute non-recurrent sinusitis - Primary    - very pleasant 61yo female presents with sinus congestion. Since symptoms have been ongoing and recently got worse I believe it is reasonable to believe that patient now has a bacterial infection. Lung exam within normal limits  - will go ahead and treat with doxycycline for 7 days. Have also give diflucan for antibiotic induced yeast infection - encouraged continued supportive care. RTC if no better       Relevant Medications   doxycycline (VIBRA-TABS) 100 MG tablet   fluconazole (DIFLUCAN) 150 MG tablet     Other   Antibiotic-induced yeast infection   Relevant Medications   fluconazole (DIFLUCAN) 150 MG tablet  Meds ordered this encounter  Medications   doxycycline (VIBRA-TABS) 100 MG tablet    Sig: Take 1 tablet (100 mg total) by mouth 2 (two) times daily for 7 days.    Dispense:  14 tablet    Refill:  0   fluconazole (DIFLUCAN) 150 MG tablet    Sig: Take one tablet by mouth. If no better in 72 hours take second tablet    Dispense:  2 tablet    Refill:  0    Return if symptoms worsen or fail to improve.  Owens Loffler, DO

## 2022-07-02 NOTE — Patient Instructions (Signed)
Increase vit C, D, and zinc

## 2022-07-02 NOTE — Assessment & Plan Note (Signed)
-   very pleasant 61yo female presents with sinus congestion. Since symptoms have been ongoing and recently got worse I believe it is reasonable to believe that patient now has a bacterial infection. Lung exam within normal limits  - will go ahead and treat with doxycycline for 7 days. Have also give diflucan for antibiotic induced yeast infection - encouraged continued supportive care. RTC if no better

## 2022-07-07 ENCOUNTER — Other Ambulatory Visit: Payer: Self-pay | Admitting: Family Medicine

## 2022-07-07 DIAGNOSIS — E7800 Pure hypercholesterolemia, unspecified: Secondary | ICD-10-CM

## 2022-07-08 ENCOUNTER — Telehealth: Payer: Self-pay | Admitting: *Deleted

## 2022-07-08 DIAGNOSIS — J019 Acute sinusitis, unspecified: Secondary | ICD-10-CM

## 2022-07-08 MED ORDER — AZITHROMYCIN 250 MG PO TABS: ORAL_TABLET | ORAL | 0 refills | Status: AC

## 2022-07-08 NOTE — Telephone Encounter (Signed)
Pt called and was seen on 12/6 by Dr. Mel Almond and was told to f/u if she wasn't any better.  She reports that her sxs started 11/8 with ear pain and pressure she thought that this was due to the airplane ride. Once she got home she started coughing up green mucus and having drainage into her throat. She continues to have the sinus issues.  Pharmacy verified.  Will fwd to pcp for advice.

## 2022-07-08 NOTE — Telephone Encounter (Signed)
Patient has been notified of new prescription. Patient states that this will be her last day for Doxycycline and will start the Azithromycin tomorrow.

## 2022-07-08 NOTE — Telephone Encounter (Signed)
Meds ordered this encounter  ?Medications  ? azithromycin (ZITHROMAX) 250 MG tablet  ?  Sig: 2 Ttabs PO on Day 1, then one a day x 4 days.  ?  Dispense:  6 tablet  ?  Refill:  0  ? ? ?

## 2022-09-19 ENCOUNTER — Ambulatory Visit (INDEPENDENT_AMBULATORY_CARE_PROVIDER_SITE_OTHER): Payer: 59

## 2022-09-19 ENCOUNTER — Ambulatory Visit (INDEPENDENT_AMBULATORY_CARE_PROVIDER_SITE_OTHER): Payer: 59 | Admitting: Sports Medicine

## 2022-09-19 DIAGNOSIS — M65312 Trigger thumb, left thumb: Secondary | ICD-10-CM

## 2022-09-19 NOTE — Progress Notes (Signed)
    Procedures performed today:    Procedure: Real-time Ultrasound Guided injection of the left flexor pollicis longus sheath Device: Samsung HS60  Verbal informed consent obtained.  Time-out conducted.  Noted no overlying erythema, induration, or other signs of local infection.  Skin prepped in a sterile fashion.  Local anesthesia: Topical Ethyl chloride.  With sterile technique and under real time ultrasound guidance: Noted normal-appearing FPL, 1/2 cc lidocaine, 1/2 cc kenalog 40 injected easily. Completed without difficulty  Advised to call if fevers/chills, erythema, induration, drainage, or persistent bleeding.  Images permanently stored and available for review in PACS.  Impression: Technically successful ultrasound guided injection.  Independent interpretation of notes and tests performed by another provider:   None.  Brief History, Exam, Impression, and Recommendations:    Trigger thumb, left thumb This is a very pleasant 62 year old female, she has a history of a left trigger thumb. We injected her in March 2023 and she did well until recently, now having recurrence of pain and triggering. She has been doing her home physical therapy for several weeks now. Today we did a FPL injection with ultrasound guidance, she will continue home physical therapy and return to see me as needed.    ____________________________________________ Gwen Her. Dianah Field, M.D., ABFM., CAQSM., AME. Primary Care and Sports Medicine [facility]  Adjunct Professor of [employer]  Risk manager

## 2022-09-19 NOTE — Assessment & Plan Note (Signed)
This is a very pleasant 62 year old female, she has a history of a left trigger thumb. We injected her in March 2023 and she did well until recently, now having recurrence of pain and triggering. She has been doing her home physical therapy for several weeks now. Today we did a FPL injection with ultrasound guidance, she will continue home physical therapy and return to see me as needed.

## 2022-10-02 ENCOUNTER — Other Ambulatory Visit: Payer: Self-pay | Admitting: Family Medicine

## 2022-10-02 DIAGNOSIS — E7800 Pure hypercholesterolemia, unspecified: Secondary | ICD-10-CM

## 2022-10-03 ENCOUNTER — Other Ambulatory Visit: Payer: Self-pay | Admitting: Family Medicine

## 2022-10-28 ENCOUNTER — Other Ambulatory Visit: Payer: Self-pay | Admitting: Family Medicine

## 2022-10-28 DIAGNOSIS — E7800 Pure hypercholesterolemia, unspecified: Secondary | ICD-10-CM

## 2022-10-30 ENCOUNTER — Telehealth: Payer: Self-pay | Admitting: Family Medicine

## 2022-10-30 DIAGNOSIS — Z0000 Encounter for general adult medical examination without abnormal findings: Secondary | ICD-10-CM

## 2022-10-30 NOTE — Telephone Encounter (Signed)
Pt called and is requesting to have blood work or labs ordered before her physical on 11/25/2022. Pt would like to go over the results during her physical.

## 2022-10-30 NOTE — Telephone Encounter (Signed)
Orders Placed This Encounter  Procedures   Lipid Panel w/reflex Direct LDL   COMPLETE METABOLIC PANEL WITH GFR   CBC     

## 2022-10-31 NOTE — Telephone Encounter (Signed)
Patient has been updated and will complete her lab order prior to her visit with the provider.

## 2022-11-01 ENCOUNTER — Other Ambulatory Visit: Payer: Self-pay | Admitting: Family Medicine

## 2022-11-01 DIAGNOSIS — E7800 Pure hypercholesterolemia, unspecified: Secondary | ICD-10-CM

## 2022-11-10 ENCOUNTER — Other Ambulatory Visit: Payer: Self-pay | Admitting: Family Medicine

## 2022-11-10 DIAGNOSIS — F439 Reaction to severe stress, unspecified: Secondary | ICD-10-CM

## 2022-11-25 ENCOUNTER — Ambulatory Visit (INDEPENDENT_AMBULATORY_CARE_PROVIDER_SITE_OTHER): Payer: 59 | Admitting: Family Medicine

## 2022-11-25 ENCOUNTER — Encounter: Payer: Self-pay | Admitting: Family Medicine

## 2022-11-25 VITALS — BP 127/51 | HR 46 | Ht 66.0 in | Wt 196.0 lb

## 2022-11-25 DIAGNOSIS — Z1211 Encounter for screening for malignant neoplasm of colon: Secondary | ICD-10-CM

## 2022-11-25 DIAGNOSIS — E7800 Pure hypercholesterolemia, unspecified: Secondary | ICD-10-CM | POA: Diagnosis not present

## 2022-11-25 MED ORDER — METAXALONE 800 MG PO TABS: 800.0000 mg | ORAL_TABLET | Freq: Two times a day (BID) | ORAL | 1 refills | Status: DC | PRN

## 2022-11-25 MED ORDER — CLOBETASOL PROP EMOLLIENT BASE 0.05 % EX CREA: 1.0000 | TOPICAL_CREAM | Freq: Two times a day (BID) | CUTANEOUS | 0 refills | Status: AC

## 2022-11-25 MED ORDER — SIMVASTATIN 20 MG PO TABS: 20.0000 mg | ORAL_TABLET | Freq: Every day | ORAL | 3 refills | Status: DC

## 2022-11-25 MED ORDER — LANSOPRAZOLE 30 MG PO CPDR: 30.0000 mg | DELAYED_RELEASE_CAPSULE | Freq: Two times a day (BID) | ORAL | 11 refills | Status: DC

## 2022-11-25 NOTE — Patient Instructions (Signed)
Ardelle Park is the massage therapist I use. She is amazing.  She is in the [employer].

## 2022-11-25 NOTE — Progress Notes (Signed)
Complete physical exam  Patient: Tonya Banks   DOB: Jul 22, 1961   61 y.o. Female  MRN: [id number]  Subjective:    Chief Complaint  Patient presents with   Annual Exam    Tonya Banks is a 62 y.o. female who presents today for a complete physical exam. She reports consuming a general diet.  Exercising 3 days a week with strength training and 2 days a week with walking.  She generally feels well.  She does not have additional problems to discuss today.   She would like a refill on metaxalone she says the Flexeril is little too sedating.  Brought in a copy of biometric screening from [facility].  Total cholesterol was 163, HDL 61, triglycerides 74 glucose 90, LDL was 83.  Blood pressure that day at the screening was 122/72.  Most recent fall risk assessment:    11/25/2022    8:59 AM  Fall Risk   Falls in the past year? 0  Number falls in past yr: 0  Injury with Fall? 0  Risk for fall due to : No Fall Risks  Follow up Falls evaluation completed     Most recent depression screenings:    07/02/2022    8:12 AM 05/22/2022    3:55 PM  PHQ 2/9 Scores  PHQ - 2 Score 0 0  PHQ- 9 Score  0        Patient Care Team: Agapito Games, MD as PCP - General   Outpatient Medications Prior to Visit  Medication Sig   Coenzyme Q10 (CO Q 10 PO) Take 1 tablet by mouth daily.   diclofenac Sodium (VOLTAREN) 1 % GEL Apply 4 g topically 4 (four) times daily.   escitalopram (LEXAPRO) 20 MG tablet TAKE 1 TABLET BY MOUTH EVERY DAY   fish oil-omega-3 fatty acids 1000 MG capsule Take 2 g by mouth daily.   gabapentin (NEURONTIN) 100 MG capsule TAKE 1-3 CAPSULES (100-300 MG TOTAL) BY MOUTH AT BEDTIME.   olmesartan (BENICAR) 5 MG tablet TAKE 1 TABLET (5 MG TOTAL) BY MOUTH DAILY.   Prenatal Vit-Fe Fumarate-FA (MULTIVITAMIN-PRENATAL) 27-0.8 MG TABS tablet Take 1 tablet by mouth daily at 12 noon.   Turmeric 500 MG TABS Take 1,000 mg by mouth daily.   valACYclovir (VALTREX) 1000 MG  tablet TAKE 1 TABLET BY MOUTH TWICE A DAY FOR A WEEK AS NEEDED   [DISCONTINUED] cyclobenzaprine (FLEXERIL) 10 MG tablet Take 1 tablet (10 mg total) by mouth at bedtime as needed for muscle spasms.   [DISCONTINUED] Efinaconazole 10 % SOLN Apply 1 drop topically daily.   [DISCONTINUED] lansoprazole (PREVACID) 30 MG capsule Take 1 capsule (30 mg total) by mouth 2 (two) times daily before a meal.   [DISCONTINUED] simvastatin (ZOCOR) 20 MG tablet Take 1 tablet (20 mg total) by mouth daily at 6 PM. Needs appointment to refill prescription   No facility-administered medications prior to visit.    ROS        Objective:     BP (!) 127/51   Pulse (!) 46   Ht 5\' 6"  (1.676 m)   Wt 196 lb (88.9 kg)   LMP 11/11/2013   SpO2 100%   BMI 31.64 kg/m    Physical Exam Vitals and nursing note reviewed.  Constitutional:      Appearance: She is well-developed.  HENT:     Head: Normocephalic and atraumatic.     Right Ear: Tympanic membrane, ear canal and external ear normal.  Left Ear: Tympanic membrane, ear canal and external ear normal.     Nose: Nose normal.     Mouth/Throat:     Pharynx: Oropharynx is clear.  Eyes:     Conjunctiva/sclera: Conjunctivae normal.     Pupils: Pupils are equal, round, and reactive to light.  Neck:     Thyroid: No thyromegaly.  Cardiovascular:     Rate and Rhythm: Normal rate and regular rhythm.     Heart sounds: Normal heart sounds.  Pulmonary:     Effort: Pulmonary effort is normal.     Breath sounds: Normal breath sounds. No wheezing.  Musculoskeletal:     Cervical back: Neck supple.  Lymphadenopathy:     Cervical: No cervical adenopathy.  Skin:    General: Skin is warm and dry.  Neurological:     Mental Status: She is alert and oriented to person, place, and time.  Psychiatric:        Behavior: Behavior normal.      No results found for any visits on 11/25/22.     Assessment & Plan:    Routine Health Maintenance and Physical  Exam  Immunization History  Administered Date(s) Administered   COVID-19, mRNA, vaccine(Comirnaty)12 years and older 05/05/2022   Influenza Split 05/20/2012, 05/01/2022   Influenza Whole 04/27/2010   Influenza,inj,Quad PF,6+ Mos 05/11/2014, 04/28/2015, 04/12/2020   Influenza-Unspecified 05/16/2017, 04/07/2019, 04/27/2021   PFIZER Comirnaty(Gray Top)Covid-19 Tri-Sucrose Vaccine 11/23/2020   PFIZER(Purple Top)SARS-COV-2 Vaccination 10/05/2019, 10/26/2019   Pfizer Covid-19 Vaccine Bivalent Booster 61yrs & up 04/27/2021   Td 06/27/1997, 03/23/2007   Tdap 10/07/2017   Zoster Recombinat (Shingrix) 03/16/2018, 05/14/2018    Health Maintenance  Topic Date Due   COLONOSCOPY (Pts 45-33yrs Insurance coverage will need to be confirmed)  02/10/2023   INFLUENZA VACCINE  02/26/2023   MAMMOGRAM  02/27/2024   PAP SMEAR-Modifier  08/20/2026   DTaP/Tdap/Td (4 - Td or Tdap) 10/08/2027   COVID-19 Vaccine  Completed   Hepatitis C Screening  Completed   HIV Screening  Completed   Zoster Vaccines- Shingrix  Completed   Pneumococcal Vaccine 71-12 Years old  Aged Out   HPV VACCINES  Aged Out    Discussed health benefits of physical activity, and encouraged her to engage in regular exercise appropriate for her age and condition.  Problem List Items Addressed This Visit       Other   HYPERCHOLESTEROLEMIA   Relevant Medications   simvastatin (ZOCOR) 20 MG tablet   Other Visit Diagnoses     Screening for malignant neoplasm of colon    -  Primary      Return in about 6 months (around 05/27/2023) for Hypertension.   Keep up a regular exercise program and make sure you are eating a healthy diet Try to eat 4 servings of dairy a day, or if you are lactose intolerant take a calcium with vitamin D daily.  Your vaccines are up to date.  Labs were drawn today.  Will call with results once available.   Nani Gasser, MD

## 2022-11-26 LAB — CBC
HCT: 39.8 % (ref 35.0–45.0)
Hemoglobin: 13.5 g/dL (ref 11.7–15.5)
MCH: 31.3 pg (ref 27.0–33.0)
MCHC: 33.9 g/dL (ref 32.0–36.0)
MCV: 92.3 fL (ref 80.0–100.0)
MPV: 10.2 fL (ref 7.5–12.5)
Platelets: 228 10*3/uL (ref 140–400)
RBC: 4.31 10*6/uL (ref 3.80–5.10)
RDW: 12.1 % (ref 11.0–15.0)
WBC: 5.1 10*3/uL (ref 3.8–10.8)

## 2022-11-26 LAB — COMPLETE METABOLIC PANEL WITH GFR
AG Ratio: 1.7 (calc) (ref 1.0–2.5)
ALT: 22 U/L (ref 6–29)
AST: 21 U/L (ref 10–35)
Albumin: 4.3 g/dL (ref 3.6–5.1)
Alkaline phosphatase (APISO): 75 U/L (ref 37–153)
BUN: 14 mg/dL (ref 7–25)
CO2: 26 mmol/L (ref 20–32)
Calcium: 9.2 mg/dL (ref 8.6–10.4)
Chloride: 107 mmol/L (ref 98–110)
Creat: 0.68 mg/dL (ref 0.50–1.05)
Globulin: 2.5 g/dL (calc) (ref 1.9–3.7)
Glucose, Bld: 94 mg/dL (ref 65–99)
Potassium: 4.2 mmol/L (ref 3.5–5.3)
Sodium: 142 mmol/L (ref 135–146)
Total Bilirubin: 0.5 mg/dL (ref 0.2–1.2)
Total Protein: 6.8 g/dL (ref 6.1–8.1)
eGFR: 100 mL/min/{1.73_m2} (ref >=60)

## 2022-11-26 LAB — LIPID PANEL W/REFLEX DIRECT LDL
Cholesterol: 145 mg/dL (ref <200)
HDL: 56 mg/dL (ref >=50)
LDL Cholesterol (Calc): 72 mg/dL (calc)
Non-HDL Cholesterol (Calc): 89 mg/dL (calc) (ref <130)
Total CHOL/HDL Ratio: 2.6 (calc) (ref <5.0)
Triglycerides: 86 mg/dL (ref <150)

## 2022-11-26 NOTE — Progress Notes (Signed)
Your lab work is within acceptable range and there are no concerning findings.   ?

## 2022-11-30 ENCOUNTER — Other Ambulatory Visit: Payer: Self-pay | Admitting: Family Medicine

## 2022-12-05 ENCOUNTER — Other Ambulatory Visit: Payer: Self-pay | Admitting: Family Medicine

## 2022-12-05 DIAGNOSIS — F439 Reaction to severe stress, unspecified: Secondary | ICD-10-CM

## 2023-01-05 ENCOUNTER — Other Ambulatory Visit: Payer: Self-pay | Admitting: Family Medicine

## 2023-01-20 ENCOUNTER — Encounter: Payer: Self-pay | Admitting: Family Medicine

## 2023-02-05 ENCOUNTER — Ambulatory Visit: Payer: 59 | Admitting: Sports Medicine

## 2023-02-05 ENCOUNTER — Other Ambulatory Visit (INDEPENDENT_AMBULATORY_CARE_PROVIDER_SITE_OTHER): Payer: 59

## 2023-02-05 DIAGNOSIS — M654 Radial styloid tenosynovitis [de Quervain]: Secondary | ICD-10-CM

## 2023-02-05 NOTE — Progress Notes (Signed)
    Procedures performed today:    Procedure: Real-time Ultrasound Guided injection of the right first extensor compartment Device: Samsung HS60  Verbal informed consent obtained.  Time-out conducted.  Noted no overlying erythema, induration, or other signs of local infection.  Skin prepped in a sterile fashion.  Local anesthesia: Topical Ethyl chloride.  With sterile technique and under real time ultrasound guidance: Tenosynovitis noted, 1 cc Kenalog 40, 1 cc lidocaine, 1 cc bupivacaine injected easily Completed without difficulty  Advised to call if fevers/chills, erythema, induration, drainage, or persistent bleeding.  Images permanently stored and available for review in PACS.  Impression: Technically successful ultrasound guided injection.  Independent interpretation of notes and tests performed by another provider:   None.  Brief History, Exam, Impression, and Recommendations:    De Quervain's tenosynovitis, right Very pleasant 62 year old female, 2 and half months pain right wrist radial aspect, positive Finkelstein sign on exam. We did a first extensor compartment injection today, home PT given, return to see me in 6 weeks.    ____________________________________________ Ihor Austin. Benjamin Stain, M.D., ABFM., CAQSM., AME. Primary Care and Sports Medicine  [facility]  Adjunct Professor of Family Medicine  [employer] [facility] [employer] [facility] [employer]  Restaurant manager, fast food

## 2023-02-05 NOTE — Assessment & Plan Note (Signed)
Very pleasant 62 year old female, 2 and half months pain right wrist radial aspect, positive Finkelstein sign on exam. We did a first extensor compartment injection today, home PT given, return to see me in 6 weeks.

## 2023-03-10 ENCOUNTER — Other Ambulatory Visit: Payer: Self-pay | Admitting: Family Medicine

## 2023-03-10 DIAGNOSIS — Z1231 Encounter for screening mammogram for malignant neoplasm of breast: Secondary | ICD-10-CM

## 2023-03-11 ENCOUNTER — Ambulatory Visit (INDEPENDENT_AMBULATORY_CARE_PROVIDER_SITE_OTHER): Payer: 59

## 2023-03-11 DIAGNOSIS — Z1231 Encounter for screening mammogram for malignant neoplasm of breast: Secondary | ICD-10-CM

## 2023-03-13 NOTE — Progress Notes (Signed)
Please call patient. Normal mammogram.  Repeat in 1 year.  

## 2023-03-24 LAB — HM COLONOSCOPY

## 2023-04-29 ENCOUNTER — Other Ambulatory Visit: Payer: Self-pay | Admitting: Family Medicine

## 2023-05-03 ENCOUNTER — Other Ambulatory Visit: Payer: Self-pay | Admitting: Sports Medicine

## 2023-05-08 ENCOUNTER — Encounter: Payer: Self-pay | Admitting: Family Medicine

## 2023-05-08 ENCOUNTER — Ambulatory Visit
Admission: EM | Admit: 2023-05-08 | Discharge: 2023-05-08 | Disposition: A | Discharge status: Home / Self Care | Payer: 59 | Attending: Family Medicine | Admitting: Family Medicine

## 2023-05-08 DIAGNOSIS — J209 Acute bronchitis, unspecified: Secondary | ICD-10-CM | POA: Diagnosis not present

## 2023-05-08 DIAGNOSIS — J069 Acute upper respiratory infection, unspecified: Secondary | ICD-10-CM | POA: Diagnosis not present

## 2023-05-08 LAB — POC SARS CORONAVIRUS 2 AG -  ED: SARS Coronavirus 2 Ag: NEGATIVE

## 2023-05-08 MED ORDER — DOXYCYCLINE HYCLATE 100 MG PO CAPS: ORAL_CAPSULE | ORAL | 0 refills | Status: DC

## 2023-05-08 NOTE — Discharge Instructions (Signed)
Take plain guaifenesin (1200mg  extended release tabs such as Mucinex) twice daily, with plenty of water, for cough and congestion.  Get adequate rest.   For sinus congestion may use Afrin nasal spray (or generic oxymetazoline) each morning for about 5 days and then discontinue.  Also recommend using saline nasal spray several times daily and saline nasal irrigation (AYR is a common brand).  Use Flonase nasal spray each morning after using Afrin nasal spray and saline nasal irrigation. Try warm salt water gargles for sore throat.  Stop all antihistamines (Zyrtec etc) for now, and other non-prescription cough/cold preparations. May take Delsym Cough Suppressant ("12 Hour Cough Relief") at bedtime for nighttime cough.   If symptoms become significantly worse during the night or over the weekend, proceed to the local emergency room.

## 2023-05-08 NOTE — ED Triage Notes (Addendum)
Sore throat Monday, Wednesday started coughing. Taking OTC cold/flu meds. At-home covid negative wednesday

## 2023-05-08 NOTE — ED Provider Notes (Signed)
[facility]    CSN: [id number] Arrival date & time: 05/08/23  0851      History   Chief Complaint Chief Complaint  Patient presents with   Sore Throat   Cough    HPI Tonya Banks is a 62 y.o. female.   Four days ago patient awoke with sore throat followed the next day by sinus congestion.  A cough appeared next and has now become productive.  She had a negative home COVID test two days ago.  This morning she developed sweats and has felt mild shortness of breath.  She also feels a vague discomfort in her right anterior chest when coughing.  The history is provided by the patient.    Past Medical History:  Diagnosis Date   Hypertension 01/30/2022    Patient Active Problem List   Diagnosis Date Noted   De Quervain's tenosynovitis, right 02/05/2023   Acute non-recurrent sinusitis 07/02/2022   Antibiotic-induced yeast infection 07/02/2022   Dizziness 01/30/2022   Hypertension 01/30/2022   Trigger thumb, left thumb 10/09/2021   Neuroma 08/16/2019   Plantar fasciitis of left foot 08/16/2019   Situational stress 08/08/2019   Breast mass, right 12/08/2017   Breast pain, right 12/08/2017   Neuropathy 03/21/2016   Left shoulder pain 10/04/2015   RBBB 12/09/2011   KNEE PAIN, RIGHT 03/12/2010   Backache 12/12/2009   IRRITABLE BOWEL SYNDROME 09/18/2008   SPRAIN/STRAIN, NECK 08/19/2006   HYPERCHOLESTEROLEMIA 05/05/2006   GASTROESOPHAGEAL REFLUX, NO ESOPHAGITIS 05/05/2006    Past Surgical History:  Procedure Laterality Date   APPENDECTOMY     BREAST BIOPSY Left    stereo 2017   breast lift  2011   FOOT SURGERY Left    Planter Fancitis   REDUCTION MAMMAPLASTY Bilateral 1992   TUBAL LIGATION     tummy tuck     uterine ablation  2000    OB History     Gravida  3   Para  3   Term  3   Preterm      AB      Living  3      SAB      IAB      Ectopic      Multiple      Live Births               Home Medications    Prior to  Admission medications   Medication Sig Start Date End Date Taking? Authorizing Provider  doxycycline (VIBRAMYCIN) 100 MG capsule Take one cap PO Q12hr with food. 05/08/23  Yes Lattie Haw, MD  Clobetasol Prop Emollient Base (CLOBETASOL PROPIONATE E) 0.05 % emollient cream Apply 1 Application topically 2 (two) times daily. 11/25/22   Agapito Games, MD  Coenzyme Q10 (CO Q 10 PO) Take 1 tablet by mouth daily.    [provider]  diclofenac Sodium (VOLTAREN) 1 % GEL Apply 4 g topically 4 (four) times daily. 08/20/21   Agapito Games, MD  escitalopram (LEXAPRO) 20 MG tablet TAKE 1 TABLET BY MOUTH EVERY DAY 12/05/22   Agapito Games, MD  fish oil-omega-3 fatty acids 1000 MG capsule Take 2 g by mouth daily.    [provider]  gabapentin (NEURONTIN) 100 MG capsule TAKE 1-3 CAPSULES (100-300 MG TOTAL) BY MOUTH AT BEDTIME. 04/29/23   Agapito Games, MD  lansoprazole (PREVACID) 30 MG capsule Take 1 capsule (30 mg total) by mouth 2 (two) times daily before a meal. 11/25/22  Agapito Games, MD  metaxalone (SKELAXIN) 800 MG tablet Take 1 tablet (800 mg total) by mouth 2 (two) times daily as needed for muscle spasms. 11/25/22   Agapito Games, MD  olmesartan (BENICAR) 5 MG tablet TAKE 1 TABLET (5 MG TOTAL) BY MOUTH DAILY. 05/04/23   Monica Becton, MD  Prenatal Vit-Fe Fumarate-FA (MULTIVITAMIN-PRENATAL) 27-0.8 MG TABS tablet Take 1 tablet by mouth daily at 12 noon.    [provider]  simvastatin (ZOCOR) 20 MG tablet Take 1 tablet (20 mg total) by mouth daily at 6 PM. 11/25/22   Agapito Games, MD  Turmeric 500 MG TABS Take 1,000 mg by mouth daily.    [provider]  valACYclovir (VALTREX) 1000 MG tablet TAKE 1 TABLET BY MOUTH TWICE A DAY FOR A WEEK AS NEEDED 04/22/22   Agapito Games, MD    Family History Family History  Problem Relation Age of Onset   Hypertension Mother    Cancer Father        mesothelioma    Gout Father    Alcoholism Brother     Social History Social History   Tobacco Use   Smoking status: Never   Smokeless tobacco: Never  Vaping Use   Vaping status: Never Used  Substance Use Topics   Alcohol use: Yes    Alcohol/week: 0.0 - 1.0 standard drinks of alcohol    Comment: occ 1 glass of wine or liquor   Drug use: No     Allergies   Dexilant [dexlansoprazole], Meloxicam, Pravastatin, and Sulfonamide derivatives   Review of Systems Review of Systems + sore throat + cough ? pleuritic pain No wheezing + nasal congestion + post-nasal drainage No sinus pain/pressure No itchy/red eyes ? earache No hemoptysis + SOB No fever, + sweats No nausea No vomiting No abdominal pain No diarrhea No urinary symptoms No skin rash + fatigue No myalgias + headache Used OTC meds (Zyrtec, Advil Cold/Flu) without relief   Physical Exam Triage Vital Signs ED Triage Vitals [05/08/23 0906]  Encounter Vitals Group     BP (!) 152/66     Systolic BP Percentile      Diastolic BP Percentile      Pulse Rate 60     Resp 16     Temp 98.8 F (37.1 C)     Temp Source Oral     SpO2 97 %     Weight      Height      Head Circumference      Peak Flow      Pain Score 0     Pain Loc      Pain Education      Exclude from Growth Chart    No data found.  Updated Vital Signs BP (!) 152/66 (BP Location: Left Arm)   Pulse 60   Temp 98.8 F (37.1 C) (Oral)   Resp 16   LMP 11/11/2013   SpO2 97%   Visual Acuity Right Eye Distance:   Left Eye Distance:   Bilateral Distance:    Right Eye Near:   Left Eye Near:    Bilateral Near:     Physical Exam Nursing notes and Vital Signs reviewed. Appearance:  Patient appears stated age, and in no acute distress Eyes:  Pupils are equal, round, and reactive to light and accomodation.  Extraocular movement is intact.  Conjunctivae are not inflamed  Ears:  Canals normal.  Tympanic membranes normal.  Nose:  Mildly congested  turbinates.  No sinus tenderness.   Pharynx:  Normal Neck:  Supple.  Mildly enlarged lateral nodes are present, tender to palpation on the left.   Lungs:  Faint rhonchi right anterior upper chest.  Breath sounds are equal.  Moving air well. Heart:  Regular rate and rhythm without murmurs, rubs, or gallops.  Abdomen:  Nontender without masses or hepatosplenomegaly.  Bowel sounds are present.  No CVA or flank tenderness.  Extremities:  No edema.  Skin:  No rash present.   UC Treatments / Results  Labs (all labs ordered are listed, but only abnormal results are displayed) Labs Reviewed  SARS CORONAVIRUS 2 (TAT 6-24 HRS)    EKG   Radiology No results found.  Procedures Procedures (including critical care time)  Medications Ordered in UC Medications - No data to display  Initial Impression / Assessment and Plan / UC Course  I have reviewed the triage vital signs and the nursing notes.  Pertinent labs & imaging results that were available during my care of the patient were reviewed by me and considered in my medical decision making (see chart for details).    Viral URI with developing bacterial bronchitis. Begin doxycycline 100mg  BID for one week. Followup with Family Doctor if not improved in one week.   Final Clinical Impressions(s) / UC Diagnoses   Final diagnoses:  Viral URI with cough  Acute bronchitis, unspecified organism     Discharge Instructions      Take plain guaifenesin (1200mg  extended release tabs such as Mucinex) twice daily, with plenty of water, for cough and congestion.  Get adequate rest.   For sinus congestion may use Afrin nasal spray (or generic oxymetazoline) each morning for about 5 days and then discontinue.  Also recommend using saline nasal spray several times daily and saline nasal irrigation (AYR is a common brand).  Use Flonase nasal spray each morning after using Afrin nasal spray and saline nasal irrigation. Try warm salt water gargles for  sore throat.  Stop all antihistamines (Zyrtec etc) for now, and other non-prescription cough/cold preparations. May take Delsym Cough Suppressant ("12 Hour Cough Relief") at bedtime for nighttime cough.   If symptoms become significantly worse during the night or over the weekend, proceed to the local emergency room.     ED Prescriptions     Medication Sig Dispense Auth. Provider   doxycycline (VIBRAMYCIN) 100 MG capsule Take one cap PO Q12hr with food. 14 capsule Lattie Haw, MD         Lattie Haw, MD 05/10/23 [phone]

## 2023-05-30 ENCOUNTER — Other Ambulatory Visit: Payer: Self-pay | Admitting: Family Medicine

## 2023-05-30 DIAGNOSIS — F439 Reaction to severe stress, unspecified: Secondary | ICD-10-CM

## 2023-07-13 ENCOUNTER — Other Ambulatory Visit (INDEPENDENT_AMBULATORY_CARE_PROVIDER_SITE_OTHER): Payer: 59

## 2023-07-13 ENCOUNTER — Ambulatory Visit: Payer: 59 | Admitting: Sports Medicine

## 2023-07-13 DIAGNOSIS — M65312 Trigger thumb, left thumb: Secondary | ICD-10-CM

## 2023-07-13 MED ORDER — TRIAMCINOLONE ACETONIDE 40 MG/ML IJ SUSP
40.0000 mg | Freq: Once | INTRAMUSCULAR | Status: AC
Start: 2023-07-13 — End: 2023-07-13
  Administered 2023-07-13: 40 mg via INTRAMUSCULAR

## 2023-07-13 NOTE — Progress Notes (Signed)
    Procedures performed today:    Procedure: Real-time Ultrasound Guided injection of the left flexor pollicis longus sheath Device: Samsung HS60  Verbal informed consent obtained.  Time-out conducted.  Noted no overlying erythema, induration, or other signs of local infection.  Skin prepped in a sterile fashion.  Local anesthesia: Topical Ethyl chloride.  With sterile technique and under real time ultrasound guidance: Noted normal-appearing FPL, 1/2 cc lidocaine, 1/2 cc kenalog 40 injected easily. Completed without difficulty  Advised to call if fevers/chills, erythema, induration, drainage, or persistent bleeding.  Images permanently stored and available for review in PACS.  Impression: Technically successful ultrasound guided injection.  Independent interpretation of notes and tests performed by another provider:   None.  Brief History, Exam, Impression, and Recommendations:    Trigger thumb, left thumb Very pleasant 62 year old female, chronic left trigger thumb, she had an injection in March 2023 and February of this year. Now with a recurrence, proceeding with repeat flexor pollicis longus tendon sheath injection, she has been doing her home PT. I think is time for the surgical consult.    ____________________________________________ Ihor Austin. Benjamin Stain, M.D., ABFM., CAQSM., AME. Primary Care and Sports Medicine [facility]  Adjunct Professor of Family Medicine  [place] [employer]  Restaurant manager, fast food

## 2023-07-13 NOTE — Assessment & Plan Note (Addendum)
Very pleasant 62 year old female, chronic left trigger thumb, she had an injection in March 2023 and February of this year. Now with a recurrence, proceeding with repeat flexor pollicis longus tendon sheath injection, she has been doing her home PT. I think is time for the surgical consult.

## 2023-07-17 ENCOUNTER — Other Ambulatory Visit: Payer: Self-pay | Admitting: Family Medicine

## 2023-08-02 ENCOUNTER — Other Ambulatory Visit: Payer: Self-pay | Admitting: Sports Medicine

## 2023-08-17 ENCOUNTER — Encounter: Payer: Self-pay | Admitting: Family Medicine

## 2023-08-18 NOTE — Telephone Encounter (Signed)
I think we just need to print these and then get them scanned into the dvanced  care planning documents section.

## 2023-08-19 NOTE — Telephone Encounter (Signed)
Sent to scan

## 2023-10-20 ENCOUNTER — Ambulatory Visit: Admitting: Sports Medicine

## 2023-10-20 ENCOUNTER — Ambulatory Visit

## 2023-10-20 DIAGNOSIS — M7712 Lateral epicondylitis, left elbow: Secondary | ICD-10-CM

## 2023-10-20 MED ORDER — CELECOXIB 200 MG PO CAPS
ORAL_CAPSULE | ORAL | 2 refills | Status: DC
Start: 2023-10-20 — End: 2024-01-08

## 2023-10-20 NOTE — Assessment & Plan Note (Signed)
 Pleasant 62 year old female, weeks of pain left elbow lateral aspect, on exam she has tenderness at the common extensor tendon origin with reproduction of pain with resisted extension of the wrist. We will have her restart her counterforce brace, she has not done the physical therapy so we will add this, adding Celebrex and she does have some history of gastritis. X-rays. Return to see me in 6 weeks, injection if not better.

## 2023-10-20 NOTE — Progress Notes (Signed)
    Procedures performed today:    None.  Independent interpretation of notes and tests performed by another provider:   None.  Brief History, Exam, Impression, and Recommendations:    Lateral epicondylitis, left elbow Pleasant 63 year old female, weeks of pain left elbow lateral aspect, on exam she has tenderness at the common extensor tendon origin with reproduction of pain with resisted extension of the wrist. We will have her restart her counterforce brace, she has not done the physical therapy so we will add this, adding Celebrex and she does have some history of gastritis. X-rays. Return to see me in 6 weeks, injection if not better.    ____________________________________________ Ihor Austin. Benjamin Stain, M.D., ABFM., CAQSM., AME. Primary Care and Sports Medicine [place] [facility] [facility]  Adjunct Professor of Family Medicine  [place] [employer] [facility] [employer]  Restaurant manager, fast food

## 2023-11-01 ENCOUNTER — Other Ambulatory Visit: Payer: Self-pay | Admitting: Family Medicine

## 2023-11-13 ENCOUNTER — Other Ambulatory Visit: Payer: Self-pay | Admitting: Family Medicine

## 2023-11-19 ENCOUNTER — Other Ambulatory Visit: Payer: Self-pay | Admitting: Family Medicine

## 2023-11-19 DIAGNOSIS — E7800 Pure hypercholesterolemia, unspecified: Secondary | ICD-10-CM

## 2023-11-25 ENCOUNTER — Other Ambulatory Visit: Payer: Self-pay | Admitting: Family Medicine

## 2023-11-25 DIAGNOSIS — F439 Reaction to severe stress, unspecified: Secondary | ICD-10-CM

## 2023-11-25 NOTE — Telephone Encounter (Signed)
 Please call pt she is due for OV for bp and medication refills. Thank you

## 2023-11-26 ENCOUNTER — Other Ambulatory Visit: Payer: Self-pay | Admitting: Family Medicine

## 2024-01-07 ENCOUNTER — Encounter: Payer: Self-pay | Admitting: Family Medicine

## 2024-01-07 DIAGNOSIS — Z0000 Encounter for general adult medical examination without abnormal findings: Secondary | ICD-10-CM

## 2024-01-08 NOTE — Telephone Encounter (Signed)
 Orders Placed This Encounter  Procedures   CMP14+EGFR   Lipid panel   CBC

## 2024-01-11 ENCOUNTER — Encounter: Payer: Self-pay | Admitting: Family Medicine

## 2024-01-11 ENCOUNTER — Ambulatory Visit (INDEPENDENT_AMBULATORY_CARE_PROVIDER_SITE_OTHER): Admitting: Family Medicine

## 2024-01-11 VITALS — BP 126/64 | HR 53 | Ht 66.0 in | Wt 203.2 lb

## 2024-01-11 DIAGNOSIS — Z23 Encounter for immunization: Secondary | ICD-10-CM | POA: Diagnosis not present

## 2024-01-11 DIAGNOSIS — R232 Flushing: Secondary | ICD-10-CM

## 2024-01-11 DIAGNOSIS — Z1231 Encounter for screening mammogram for malignant neoplasm of breast: Secondary | ICD-10-CM

## 2024-01-11 DIAGNOSIS — Z0000 Encounter for general adult medical examination without abnormal findings: Secondary | ICD-10-CM | POA: Diagnosis not present

## 2024-01-11 DIAGNOSIS — F439 Reaction to severe stress, unspecified: Secondary | ICD-10-CM | POA: Diagnosis not present

## 2024-01-11 MED ORDER — ESCITALOPRAM OXALATE 10 MG PO TABS
10.0000 mg | ORAL_TABLET | Freq: Every day | ORAL | 2 refills | Status: DC
Start: 2024-01-11 — End: 2024-06-20

## 2024-01-11 NOTE — Addendum Note (Signed)
 Addended by: Dickie Found on: 01/11/2024 09:46 AM   Modules accepted: Orders

## 2024-01-11 NOTE — Assessment & Plan Note (Signed)
 She still struggling with feeling excessively hot and sweaty and sometimes hot flashes and even though she is now 10 years post menopause she went into menopause around age 63.  We discussed that the higher dose of the Lexapro  could be contributing to some of the excess sweating so it may be reasonable now that she is planning on retiring at the end of the year to maybe step back on her dose and see if that is helpful over the next 6 weeks if she still not noticing improvement then we can also look at hormonal or nonhormonal options.  We did discuss those today.  He is actually already on gabapentin  at bedtime.

## 2024-01-11 NOTE — Progress Notes (Signed)
 Complete physical exam  Patient: Tonya Banks   DOB: 1961/04/05   63 y.o. Female  MRN: [id number]  Subjective:    Chief Complaint  Patient presents with   Annual Exam    Annual exam without pap=    Tonya Banks is a 63 y.o. female who presents today for a complete physical exam. She reports consuming a general diet. Exercises 3 days per week She generally feels well. . She does not have additional problems to discuss today.    Most recent fall risk assessment:    11/25/2022    8:59 AM  Fall Risk   Falls in the past year? 0  Number falls in past yr: 0  Injury with Fall? 0  Risk for fall due to : No Fall Risks  Follow up Falls evaluation completed     Most recent depression screenings:    07/02/2022    8:12 AM 05/22/2022    3:55 PM  PHQ 2/9 Scores  PHQ - 2 Score 0 0  PHQ- 9 Score  0        Patient Care Team: Cydney Draft, MD as PCP - General   Outpatient Medications Prior to Visit  Medication Sig   Clobetasol  Prop Emollient Base (CLOBETASOL  PROPIONATE E) 0.05 % emollient cream Apply 1 Application topically 2 (two) times daily.   Coenzyme Q10 (CO Q 10 PO) Take 1 tablet by mouth daily.   diclofenac  Sodium (VOLTAREN ) 1 % GEL Apply 4 g topically 4 (four) times daily.   fish oil-omega-3 fatty acids 1000 MG capsule Take 2 g by mouth daily.   gabapentin  (NEURONTIN ) 100 MG capsule TAKE 1-3 CAPSULES (100-300 MG TOTAL) BY MOUTH AT BEDTIME.   lansoprazole  (PREVACID ) 30 MG capsule Take 1 capsule (30 mg total) by mouth 2 (two) times daily before a meal.   metaxalone  (SKELAXIN ) 800 MG tablet TAKE 1 TABLET BY MOUTH 2 TIMES DAILY AS NEEDED FOR MUSCLE SPASMS.   olmesartan  (BENICAR ) 5 MG tablet TAKE 1 TABLET (5 MG TOTAL) BY MOUTH DAILY.   Prenatal Vit-Fe Fumarate-FA (MULTIVITAMIN-PRENATAL) 27-0.8 MG TABS tablet Take 1 tablet by mouth daily at 12 noon.   simvastatin  (ZOCOR ) 20 MG tablet TAKE 1 TABLET BY MOUTH DAILY AT 6 PM.   Turmeric 500 MG TABS Take 1,000 mg by  mouth daily.   valACYclovir  (VALTREX ) 1000 MG tablet TAKE 1 TABLET BY MOUTH TWICE A DAY FOR A WEEK AS NEEDED   [DISCONTINUED] doxycycline  (VIBRAMYCIN ) 100 MG capsule Take one cap PO Q12hr with food.   [DISCONTINUED] escitalopram  (LEXAPRO ) 20 MG tablet TAKE 1 TABLET BY MOUTH EVERY DAY   [DISCONTINUED] celecoxib  (CELEBREX ) 200 MG capsule One to 2 tablets by mouth daily as needed for pain.   No facility-administered medications prior to visit.    ROS        Objective:     BP 126/64   Pulse (!) 53   Ht 5' 6 (1.676 m)   Wt 203 lb 4 oz (92.2 kg)   LMP 11/11/2013   SpO2 98%   BMI 32.81 kg/m    Physical Exam Constitutional:      Appearance: Normal appearance.  HENT:     Head: Normocephalic and atraumatic.     Right Ear: Tympanic membrane, ear canal and external ear normal. There is no impacted cerumen.     Left Ear: Tympanic membrane, ear canal and external ear normal. There is no impacted cerumen.     Nose: Nose normal.  Mouth/Throat:     Pharynx: Oropharynx is clear.   Eyes:     Extraocular Movements: Extraocular movements intact.     Conjunctiva/sclera: Conjunctivae normal.     Pupils: Pupils are equal, round, and reactive to light.   Neck:     Thyroid: No thyromegaly.   Cardiovascular:     Rate and Rhythm: Normal rate and regular rhythm.  Pulmonary:     Effort: Pulmonary effort is normal.     Breath sounds: Normal breath sounds.  Abdominal:     General: Bowel sounds are normal.     Palpations: Abdomen is soft.     Tenderness: There is no abdominal tenderness.   Musculoskeletal:        General: No swelling.     Cervical back: Neck supple. No tenderness.  Lymphadenopathy:     Cervical: No cervical adenopathy.   Skin:    General: Skin is warm and dry.   Neurological:     Mental Status: She is alert and oriented to person, place, and time.   Psychiatric:        Mood and Affect: Mood normal.        Behavior: Behavior normal.      No results found  for any visits on 01/11/24.     Assessment & Plan:    Routine Health Maintenance and Physical Exam  Immunization History  Administered Date(s) Administered   Influenza Split 05/20/2012, 05/01/2022   Influenza Whole 04/27/2010   Influenza,inj,Quad PF,6+ Mos 05/11/2014, 04/28/2015, 04/12/2020   Influenza-Unspecified 05/16/2017, 04/07/2019, 04/27/2021   PFIZER Comirnaty(Gray Top)Covid-19 Tri-Sucrose Vaccine 11/23/2020   PFIZER(Purple Top)SARS-COV-2 Vaccination 10/05/2019, 10/26/2019   Pfizer Covid-19 Vaccine Bivalent Booster 82yrs & up 04/27/2021   Pfizer(Comirnaty)Fall Seasonal Vaccine 12 years and older 05/05/2022, 06/04/2023   Td 06/27/1997, 03/23/2007   Tdap 10/07/2017   Zoster Recombinant(Shingrix ) 03/16/2018, 05/14/2018    Health Maintenance  Topic Date Due   INFLUENZA VACCINE  02/26/2024   MAMMOGRAM  03/10/2025   Cervical Cancer Screening (HPV/Pap Cotest)  08/20/2026   DTaP/Tdap/Td (4 - Td or Tdap) 10/08/2027   Colonoscopy  03/23/2028   COVID-19 Vaccine  Completed   Hepatitis C Screening  Completed   HIV Screening  Completed   Zoster Vaccines- Shingrix   Completed   HPV VACCINES  Aged Out   Meningococcal B Vaccine  Aged Out    Discussed health benefits of physical activity, and encouraged her to engage in regular exercise appropriate for her age and condition.  Problem List Items Addressed This Visit       Other   Situational stress   She still struggling with feeling excessively hot and sweaty and sometimes hot flashes and even though she is now 10 years post menopause she went into menopause around age 89.  We discussed that the higher dose of the Lexapro  could be contributing to some of the excess sweating so it may be reasonable now that she is planning on retiring at the end of the year to maybe step back on her dose and see if that is helpful over the next 6 weeks if she still not noticing improvement then we can also look at hormonal or nonhormonal options.  We  did discuss those today.  He is actually already on gabapentin  at bedtime.      Relevant Medications   escitalopram  (LEXAPRO ) 10 MG tablet   Other Visit Diagnoses       Routine general medical examination at a health care facility    -  Primary     Encounter for screening mammogram for malignant neoplasm of breast       Relevant Orders   MM 3D SCREENING MAMMOGRAM BILATERAL BREAST     Screening mammogram for breast cancer       Relevant Orders   MM 3D SCREENING MAMMOGRAM BILATERAL BREAST     Hot flashes           Keep up a regular exercise program and make sure you are eating a healthy diet Try to eat 4 servings of dairy a day, or if you are lactose intolerant take a calcium  with vitamin D  daily.  Your vaccines are up to date.   Return in about 6 months (around 07/12/2024) for Mood.     Duaine German, MD

## 2024-01-12 ENCOUNTER — Encounter: Payer: Self-pay | Admitting: Family Medicine

## 2024-01-12 ENCOUNTER — Ambulatory Visit: Payer: Self-pay | Admitting: Family Medicine

## 2024-01-12 LAB — CMP14+EGFR
ALT: 20 IU/L (ref 0–32)
AST: 19 IU/L (ref 0–40)
Albumin: 4.3 g/dL (ref 3.9–4.9)
Alkaline Phosphatase: 92 IU/L (ref 44–121)
BUN/Creatinine Ratio: 10 — ABNORMAL LOW (ref 12–28)
BUN: 7 mg/dL — ABNORMAL LOW (ref 8–27)
Bilirubin Total: 0.4 mg/dL (ref 0.0–1.2)
CO2: 21 mmol/L (ref 20–29)
Calcium: 9.2 mg/dL (ref 8.7–10.3)
Chloride: 106 mmol/L (ref 96–106)
Creatinine, Ser: 0.7 mg/dL (ref 0.57–1.00)
Globulin, Total: 2 g/dL (ref 1.5–4.5)
Glucose: 113 mg/dL — ABNORMAL HIGH (ref 70–99)
Potassium: 4.2 mmol/L (ref 3.5–5.2)
Sodium: 143 mmol/L (ref 134–144)
Total Protein: 6.3 g/dL (ref 6.0–8.5)
eGFR: 98 mL/min/{1.73_m2} (ref >59)

## 2024-01-12 LAB — LIPID PANEL
Chol/HDL Ratio: 3.5 ratio (ref 0.0–4.4)
Cholesterol, Total: 157 mg/dL (ref 100–199)
HDL: 45 mg/dL (ref >39)
LDL Chol Calc (NIH): 90 mg/dL (ref 0–99)
Triglycerides: 124 mg/dL (ref 0–149)
VLDL Cholesterol Cal: 22 mg/dL (ref 5–40)

## 2024-01-12 LAB — CBC
Hematocrit: 40.9 % (ref 34.0–46.6)
Hemoglobin: 13.6 g/dL (ref 11.1–15.9)
MCH: 31.6 pg (ref 26.6–33.0)
MCHC: 33.3 g/dL (ref 31.5–35.7)
MCV: 95 fL (ref 79–97)
Platelets: 241 10*3/uL (ref 150–450)
RBC: 4.3 x10E6/uL (ref 3.77–5.28)
RDW: 12.2 % (ref 11.7–15.4)
WBC: 4.8 10*3/uL (ref 3.4–10.8)

## 2024-01-12 NOTE — Progress Notes (Signed)
 Hi Tonya Banks, metabolic panel overall looks great including liver and kidney function.  Cholesterol is at goal.  Blood count is normal.  Glucose was up a little bit I think you said you were fasting so I think we going to call the lab and see if they can add an A1c.  Please call lab and have them add a hemoglobin A1c.

## 2024-01-24 ENCOUNTER — Other Ambulatory Visit: Payer: Self-pay | Admitting: Family Medicine

## 2024-02-17 ENCOUNTER — Encounter: Payer: Self-pay | Admitting: Family Medicine

## 2024-02-17 ENCOUNTER — Other Ambulatory Visit: Payer: Self-pay | Admitting: Family Medicine

## 2024-02-17 DIAGNOSIS — E7800 Pure hypercholesterolemia, unspecified: Secondary | ICD-10-CM

## 2024-02-17 DIAGNOSIS — I10 Essential (primary) hypertension: Secondary | ICD-10-CM

## 2024-02-17 DIAGNOSIS — R7309 Other abnormal glucose: Secondary | ICD-10-CM

## 2024-03-16 ENCOUNTER — Ambulatory Visit

## 2024-03-16 DIAGNOSIS — Z1231 Encounter for screening mammogram for malignant neoplasm of breast: Secondary | ICD-10-CM | POA: Diagnosis not present

## 2024-03-18 ENCOUNTER — Ambulatory Visit: Payer: Self-pay | Admitting: Family Medicine

## 2024-03-18 NOTE — Progress Notes (Signed)
 Please call patient. Normal mammogram.  Repeat in 1 year.

## 2024-03-29 ENCOUNTER — Encounter: Payer: Self-pay | Admitting: Sports Medicine

## 2024-04-06 ENCOUNTER — Other Ambulatory Visit: Payer: Self-pay | Admitting: Family Medicine

## 2024-04-25 ENCOUNTER — Ambulatory Visit: Admitting: Podiatry

## 2024-04-25 ENCOUNTER — Ambulatory Visit (INDEPENDENT_AMBULATORY_CARE_PROVIDER_SITE_OTHER)

## 2024-04-25 ENCOUNTER — Encounter: Payer: Self-pay | Admitting: Podiatry

## 2024-04-25 DIAGNOSIS — G5761 Lesion of plantar nerve, right lower limb: Secondary | ICD-10-CM | POA: Diagnosis not present

## 2024-04-25 NOTE — Patient Instructions (Signed)

## 2024-04-25 NOTE — Progress Notes (Unsigned)
 Subjective:   Patient ID: Tonya Banks, female   DOB: 63 y.o.   MRN: [id number]   HPI Chief Complaint  Patient presents with   Neuroma    Rm11 Neuroma pain right 3rd Ims/ 3 months padding and splinting/aching and painful.   63 year old female presents the office today with concerns of ongoing pain in the right foot.  She previously was diagnosed with a neuroma several years ago and she has attempted conservative treatments including spacers, padding, shoe modifications, offloading, medications or any significant improvement.  At this time she wants to discuss surgical intervention given ongoing pain.  She describes pain, burning, tingling as well into the toes.   Review of Systems  All other systems reviewed and are negative.  Past Medical History:  Diagnosis Date   Hypertension 01/30/2022    Past Surgical History:  Procedure Laterality Date   APPENDECTOMY     BREAST BIOPSY Left    stereo 2017   breast lift  2011   FOOT SURGERY Left    Planter Fancitis   REDUCTION MAMMAPLASTY Bilateral 1992   TUBAL LIGATION     tummy tuck     uterine ablation  2000     Current Outpatient Medications:    Clobetasol  Prop Emollient Base (CLOBETASOL  PROPIONATE E) 0.05 % emollient cream, Apply 1 Application topically 2 (two) times daily., Disp: 30 g, Rfl: 0   Coenzyme Q10 (CO Q 10 PO), Take 1 tablet by mouth daily., Disp: , Rfl:    diclofenac  Sodium (VOLTAREN ) 1 % GEL, Apply 4 g topically 4 (four) times daily., Disp: 400 g, Rfl: 3   gabapentin  (NEURONTIN ) 100 MG capsule, TAKE 1-3 CAPSULES (100-300 MG TOTAL) BY MOUTH AT BEDTIME., Disp: 90 capsule, Rfl: 11   lansoprazole  (PREVACID ) 30 MG capsule, TAKE 1 CAPSULE (30 MG TOTAL) BY MOUTH 2 (TWO) TIMES DAILY BEFORE A MEAL., Disp: 180 capsule, Rfl: 3   metaxalone  (SKELAXIN ) 800 MG tablet, TAKE 1 TABLET BY MOUTH 2 TIMES DAILY AS NEEDED FOR MUSCLE SPASMS., Disp: 60 tablet, Rfl: 1   olmesartan  (BENICAR ) 5 MG tablet, TAKE 1 TABLET (5 MG TOTAL) BY MOUTH  DAILY., Disp: 90 tablet, Rfl: 1   Prenatal Vit-Fe Fumarate-FA (MULTIVITAMIN-PRENATAL) 27-0.8 MG TABS tablet, Take 1 tablet by mouth daily at 12 noon., Disp: , Rfl:    simvastatin  (ZOCOR ) 20 MG tablet, TAKE 1 TABLET BY MOUTH DAILY AT 6 PM., Disp: 90 tablet, Rfl: 3   valACYclovir  (VALTREX ) 1000 MG tablet, TAKE 1 TABLET BY MOUTH TWICE A DAY FOR A WEEK AS NEEDED, Disp: 14 tablet, Rfl: 6   escitalopram  (LEXAPRO ) 10 MG tablet, Take 1 tablet (10 mg total) by mouth daily., Disp: 90 tablet, Rfl: 2   fish oil-omega-3 fatty acids 1000 MG capsule, Take 2 g by mouth daily., Disp: , Rfl:    Turmeric 500 MG TABS, Take 1,000 mg by mouth daily., Disp: , Rfl:   Allergies  Allergen Reactions   Dexilant [Dexlansoprazole]     Sharp pains on stomach   Meloxicam      Pt stated, This upsets my stomach   Pravastatin  Other (See Comments)    msucle aches    Sulfamethoxazole Other (See Comments)   Sulfonamide Derivatives Other (See Comments)    Stomach pain.           Objective:  Physical Exam  General: AAO x3, NAD  Dermatological: Skin is warm, dry and supple bilateral.  There are no open sores, no preulcerative lesions, no rash or signs of  infection present.  Vascular: Dorsalis Pedis artery and Posterior Tibial artery pedal pulses are 2/4 bilateral with immedate capillary fill time. There is no pain with calf compression, swelling, warmth, erythema.   Neruologic: Grossly intact via light touch bilateral.   Musculoskeletal: Tenderness palpation along the right third interspace no palpable click is noted.  There is no area of pinpoint tenderness.  Slight edema present.  There is no erythema or warmth.  MMT 5/5.  No pain in the left lower extremity.  She is very happy with the outcome of the surgery on the left side.  Gait: Unassisted, Nonantalgic.       Assessment:   Right foot third interspace neuroma     Plan:  -Treatment options discussed including all alternatives, risks, and  complications -Etiology of symptoms were discussed - X-rays were obtained reviewed.  Multiple views were obtained.  No evidence of acute fracture.  Joint spaces maintained.  Minimal inferior calcaneal spurs present. - I send we discussed both conservative as well as surgical treatment options.  She has attempted numerous conservative treatments without any significant period.  We discussed with her neurectomy of the right third interspace and she wants to proceed with this. -The incision placement as well as the postoperative course was discussed with the patient. I discussed risks of the surgery which include, but not limited to, infection, bleeding, pain, swelling, need for further surgery, delayed or nonhealing, painful or ugly scar, numbness or sensation changes, over/under correction, recurrence, transfer lesions, further deformity, hardware failure, DVT/PE, loss of toe/foot. Patient understands these risks and wishes to proceed with surgery. The surgical consent was reviewed with the patient all 3 pages were signed. No promises or guarantees were given to the outcome of the procedure. All questions were answered to the best of my ability. Before the surgery the patient was encouraged to call the office if there is any further questions. The surgery will be performed at the [facility] on an outpatient basis on 06/15/2024  No follow-ups on file.  Donnice JONELLE Fees DPM

## 2024-04-27 ENCOUNTER — Telehealth: Payer: Self-pay | Admitting: Podiatry

## 2024-04-27 NOTE — Telephone Encounter (Signed)
 Pt returned call and is scheduled for 06/15/24 for surgery. Is aware to stop the 81mg  asprin 1 wk prior to surgery and does not take any GLP1 medications.  Pharmacy correct in chart.   Pt did ask about how long is she to expect to be out of work?

## 2024-04-28 NOTE — Telephone Encounter (Signed)
 Left message for pt what Dr Gershon stated and to call if any questions.

## 2024-04-28 NOTE — Telephone Encounter (Signed)
 Pt called back and was concerned about wearing the surgical shoe and driving and I told her she she would be advised if she should wear it while driving but I think she maybe able to take it off just to drive depending on type of shoe.   She also discussed short term disability and gave her the fax number to [facility] for that.

## 2024-05-13 ENCOUNTER — Telehealth: Payer: Self-pay | Admitting: Podiatry

## 2024-05-13 NOTE — Telephone Encounter (Signed)
 DOS- 06/15/2024  3RD INTERSPACE NEURECTOMY RT- 28080  HEALTHGRAM EFFECTIVE DATE- 11/30/2019  DEDUCTIBLE- $600 REMAINING- $576.62 OOP- $6200 REMAINING- $4162.62  PER IVY WITH HEALTHGRAM, NO PRIOR AUTH IS REQUIRED FOR CPT CODE 71919. REF# [id number] D. 9:42 AM EST 05/13/2024

## 2024-06-10 ENCOUNTER — Other Ambulatory Visit: Payer: Self-pay | Admitting: Family Medicine

## 2024-06-13 ENCOUNTER — Telehealth: Payer: Self-pay | Admitting: Podiatry

## 2024-06-13 NOTE — Telephone Encounter (Signed)
 Leave forms were never recd for this pt See notes of fax# was given to her to have forms faxed

## 2024-06-15 ENCOUNTER — Other Ambulatory Visit: Payer: Self-pay | Admitting: Podiatry

## 2024-06-15 DIAGNOSIS — G5761 Lesion of plantar nerve, right lower limb: Secondary | ICD-10-CM | POA: Diagnosis not present

## 2024-06-15 MED ORDER — OXYCODONE-ACETAMINOPHEN 5-325 MG PO TABS
1.0000 | ORAL_TABLET | Freq: Four times a day (QID) | ORAL | 0 refills | Status: DC | PRN
Start: 2024-06-15 — End: 2024-06-20

## 2024-06-15 MED ORDER — PROMETHAZINE HCL 25 MG PO TABS: 25.0000 mg | ORAL_TABLET | Freq: Three times a day (TID) | ORAL | 0 refills | Status: DC | PRN

## 2024-06-15 MED ORDER — CEPHALEXIN 500 MG PO CAPS: 500.0000 mg | ORAL_CAPSULE | Freq: Three times a day (TID) | ORAL | 0 refills | Status: DC

## 2024-06-20 ENCOUNTER — Encounter: Payer: Self-pay | Admitting: Family Medicine

## 2024-06-20 ENCOUNTER — Ambulatory Visit: Admitting: Podiatry

## 2024-06-20 ENCOUNTER — Encounter: Payer: Self-pay | Admitting: Podiatry

## 2024-06-20 VITALS — BP 164/75 | HR 82 | Temp 97.9°F

## 2024-06-20 DIAGNOSIS — R7309 Other abnormal glucose: Secondary | ICD-10-CM

## 2024-06-20 DIAGNOSIS — I10 Essential (primary) hypertension: Secondary | ICD-10-CM

## 2024-06-20 DIAGNOSIS — G5761 Lesion of plantar nerve, right lower limb: Secondary | ICD-10-CM

## 2024-06-20 NOTE — Telephone Encounter (Signed)
 CMP please thank you

## 2024-06-20 NOTE — Progress Notes (Unsigned)
 Patient presents for post-op visit today, POV # 1 DOS 06/15/24 RT 3RD INTERSPACE EXCISION OF NEUROMA  Doing okay. The pain is coming down every single day. On Tylenol  here and there. Have not been up much. Walking a little each day. Used a crutch the first couple of days. .  RN Notes: Patient received some bad news about her cat right after surgery. She is upset today, BP is high. Will recheck before she leaves.   Vital Signs: Today's Vitals   06/20/24 1547  BP: (!) 177/77  Pulse: 82  Temp: 97.9 F (36.6 C)  TempSrc: Oral  PainSc: 3   PainLoc: Foot      Radiographs: []  Taken [x]  Not taken  Surgical Site Assessment:  - Dressing:  [x]  Minimal dry blood, intact []  Reinforced   []  Changed     -RN Notes: n/a  - Incision:  [x]  CDI (clean, dry, intact)  [x]  Mild erythema  []  Drainage noted   -RN Notes: n/a  - Swelling:  []  None  [x]  Mild  []  Moderate   []  Significant     -RN Notes: n/a  - Bruising:  []  None  [x]  Present: 3rd & 4th toe.    - Sutures/Staples:  []  None [x]  Intact  []  Removed Today  [x]  Plan to remove at next visit   -Cast/Splint/Pins: [x]  None []  Intact []  Removed Today []  Plan to remove at next visit []  Replaced  -Signs of infection:  [x]  None  []  Present - Describe: n/a  -DME:    []  None []  AFW [x]  Surgical shoe []  Cast  []  Splint  -Walking status:  [x]  Full WB  []  Partial WB  []  NWB  -Utilizing device:  [x]  None []  Knee Scooter []  Crutches []  Wheelchair    DVT assessment:  [x]  Denies symptoms []  Chest pain/SOB []  Pain in calf/redness/warmth   Redressed DSD and ace wrap. Educated on signs of infection, proper dressing care, pain management, and weight bearing status. Patient will contact provider with any new or worsening symptoms. The provider assessed the patient today and reviewed instructions regarding plan of care.  -  Patient seen and evaluated by myself.  States that she did not want her pain is controlled.  Incision healing well any signs of  infection.  No drainage or pus or erythema.  Minimal edema.  Bruising present.  Dressing reapplied.  Continue weightbearing although limited in surgical shoe.  Continue ice, elevate.  Return in about 2 weeks (around 07/04/2024) for F/U already made, please confirm with patient.SABRA Donnice JONELLE Gershon DPM

## 2024-06-20 NOTE — Patient Instructions (Signed)

## 2024-06-21 NOTE — Telephone Encounter (Signed)
 Added CMP and A1C  to lab order and placed in patient chart. She was informed and plans to have this drawn today 06/21/2024 or tomorrow 06/22/2024

## 2024-06-22 ENCOUNTER — Ambulatory Visit: Payer: Self-pay | Admitting: Family Medicine

## 2024-06-22 LAB — CMP14+EGFR
ALT: 25 IU/L (ref 0–32)
AST: 25 IU/L (ref 0–40)
Albumin: 4.3 g/dL (ref 3.9–4.9)
Alkaline Phosphatase: 87 IU/L (ref 49–135)
BUN/Creatinine Ratio: 20 (ref 12–28)
BUN: 13 mg/dL (ref 8–27)
Bilirubin Total: 0.4 mg/dL (ref 0.0–1.2)
CO2: 22 mmol/L (ref 20–29)
Calcium: 9.8 mg/dL (ref 8.7–10.3)
Chloride: 107 mmol/L — ABNORMAL HIGH (ref 96–106)
Creatinine, Ser: 0.64 mg/dL (ref 0.57–1.00)
Globulin, Total: 2.3 g/dL (ref 1.5–4.5)
Glucose: 107 mg/dL — ABNORMAL HIGH (ref 70–99)
Potassium: 4.5 mmol/L (ref 3.5–5.2)
Sodium: 144 mmol/L (ref 134–144)
Total Protein: 6.6 g/dL (ref 6.0–8.5)
eGFR: 100 mL/min/1.73 (ref >59)

## 2024-06-22 LAB — HEMOGLOBIN A1C
Est. average glucose Bld gHb Est-mCnc: 126 mg/dL
Hgb A1c MFr Bld: 6 % — ABNORMAL HIGH (ref 4.8–5.6)

## 2024-06-22 NOTE — Progress Notes (Signed)
 Hi Tonya Banks, A1c is up just slightly at 6.0 in the prediabetes range just wanted to continue to work on increasing vegetable intake and decreasing carbs and starchy intake.  Regular exercise can help lower blood glucose levels as well.  Metabolic panel overall looks okay

## 2024-06-28 ENCOUNTER — Ambulatory Visit: Payer: Self-pay | Admitting: Podiatry

## 2024-06-28 ENCOUNTER — Ambulatory Visit: Admitting: Family Medicine

## 2024-06-28 ENCOUNTER — Encounter: Payer: Self-pay | Admitting: Podiatry

## 2024-06-30 ENCOUNTER — Encounter: Admitting: Podiatry

## 2024-07-07 ENCOUNTER — Ambulatory Visit: Admitting: Podiatry

## 2024-07-07 VITALS — Ht 66.0 in | Wt 203.2 lb

## 2024-07-13 NOTE — Progress Notes (Signed)
 Subjective: Chief Complaint  Patient presents with   Routine Post Op     POV # 2 DOS 06/15/24 RT 3RD INTERSPACE EXCISION OF NEUROMA, pt is here to f/u on the right foot she states everything is going well has the occasional pain here and there, but nothing major, continues to wear pos op shoe, she has no complaints.   63 year old female presents the [place] above concerns.  Said that she has been doing well.  Minimal discomfort.  She continues with postop shoe.  She presents the procedure below.  Does not report any fevers or chills or any other concerns today.  Objective: AAO x3, NAD DP/PT pulses palpable bilaterally, CRT less than 3 seconds Incision well coapted with sutures intact.  There is no surrounding erythema, ascending size but there is no drainage or pus.  No signs of infection.  Minimal discomfort at the surgical site. No pain with calf compression, swelling, warmth, erythema  Assessment: Post neuroma excision, healing well  Plan: -All treatment options discussed with the patient including all alternatives, risks, complications.  -Incisions healing well.  Sutures removed out complications.  Steri-Strips applied for reinforcement followed by bandage.  She has already washed with soap and water, dry thoroughly and apply a similar bandage.  Remain in surgical shoe for at least another week or so until no pain in the incisions more healed as far as formed.  That she can start to gradually transition to a shoe as tolerated.  Continue ice, elevate. -Patient encouraged to call the office with any questions, concerns, change in symptoms.   Donnice JONELLE Fees DPM

## 2024-07-14 ENCOUNTER — Encounter: Admitting: Podiatry

## 2024-08-16 ENCOUNTER — Ambulatory Visit: Admitting: Family Medicine

## 2024-08-16 ENCOUNTER — Encounter: Payer: Self-pay | Admitting: Family Medicine

## 2024-08-16 VITALS — BP 127/59 | HR 56 | Ht 66.0 in | Wt 191.4 lb

## 2024-08-16 DIAGNOSIS — I10 Essential (primary) hypertension: Secondary | ICD-10-CM | POA: Diagnosis not present

## 2024-08-16 DIAGNOSIS — M25561 Pain in right knee: Secondary | ICD-10-CM | POA: Diagnosis not present

## 2024-08-16 DIAGNOSIS — G629 Polyneuropathy, unspecified: Secondary | ICD-10-CM | POA: Diagnosis not present

## 2024-08-16 DIAGNOSIS — R7301 Impaired fasting glucose: Secondary | ICD-10-CM | POA: Insufficient documentation

## 2024-08-16 DIAGNOSIS — M170 Bilateral primary osteoarthritis of knee: Secondary | ICD-10-CM

## 2024-08-16 DIAGNOSIS — K219 Gastro-esophageal reflux disease without esophagitis: Secondary | ICD-10-CM | POA: Diagnosis not present

## 2024-08-16 DIAGNOSIS — M79671 Pain in right foot: Secondary | ICD-10-CM

## 2024-08-16 MED ORDER — OLMESARTAN MEDOXOMIL 5 MG PO TABS: 5.0000 mg | ORAL_TABLET | Freq: Every day | ORAL | 2 refills | Status: AC

## 2024-08-16 MED ORDER — DICLOFENAC SODIUM 1 % EX GEL: 4.0000 g | Freq: Four times a day (QID) | CUTANEOUS | 3 refills | Status: AC

## 2024-08-16 NOTE — Assessment & Plan Note (Signed)
 Stays only takes the Prevacid  once a day and will take a second dose at bedtime occasionally as needed.

## 2024-08-16 NOTE — Assessment & Plan Note (Signed)
 Primary hypertension Blood pressure well-controlled with current medication. Lifestyle changes may contribute to improved control. - Continue current antihypertensive medication regimen.

## 2024-08-16 NOTE — Progress Notes (Signed)
 "  Established Patient Office Visit  Patient ID: Tonya Banks, female    DOB: 1961/03/04  Age: 64 y.o. MRN: [id number] PCP: Alvan Dorothyann BIRCH, MD  Chief Complaint  Patient presents with   Medical Management of Chronic Issues   ifg    Subjective:     HPI  Discussed the use of AI scribe software for clinical note transcription with the patient, who gave verbal consent to proceed.  History of Present Illness Tonya Banks is a 64 year old female who presents for follow-up regarding her weight management and blood pressure control.  Menopausal symptoms - Significant improvement in menopausal symptoms - No longer experiencing hot flashes - Feeling chilly more often, which is a new sensation  Weight management - Weight has decreased since last visit, > 10 lbs  - Attributed to healthier eating habits, increased physical activity, and use of Glucovim supplement - Initially took two Glucovim pills daily, developed burning sensation in feet, suspected due to rapid decrease in A1c - Reduced Glucovim to one pill daily, which has helped manage sugar cravings - Focuses on eating more vegetables and reducing snacking after dinner  Physical activity - Walking approximately 6,000 steps daily - Resumed kettlebell exercises three times per week after recovery from Morton's neuroma surgery  Blood pressure control - Blood pressure was well controlled at last check  Sleep quality - Sleeping well  Sinus and ear symptoms - Occasional sinus pressure and ear pain - No significant nasal discharge  Psychosocial stressors - Experienced stress due to unexpected passing of a coworker - Work microbiologist, resulting in delayed retirement plans - 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  Constitutional:      Appearance: Normal appearance.  HENT:     Head: Normocephalic and atraumatic.  Eyes:     Conjunctiva/sclera: Conjunctivae normal.  Cardiovascular:     Rate and Rhythm: Normal rate and regular rhythm.  Pulmonary:     Effort: Pulmonary effort is normal.     Breath sounds: Normal breath sounds.  Skin:    General: Skin is warm and dry.  Neurological:     Mental Status: She is alert.  Psychiatric:        Mood and Affect: Mood normal.      No results found for any visits on 08/16/24.    The 10-year ASCVD risk score (Arnett DK, et al., 2019) is: 5.1%    Assessment & Plan:   Problem List Items Addressed This Visit       Cardiovascular and Mediastinum   Hypertension - Primary   Primary hypertension Blood pressure well-controlled with current medication. Lifestyle changes may contribute to improved control. - Continue current antihypertensive medication regimen.      Relevant Medications   olmesartan  (BENICAR ) 5 MG tablet     Digestive   GASTROESOPHAGEAL REFLUX, NO ESOPHAGITIS   Stays only takes the Prevacid  once a day and will take a second dose at bedtime occasionally as needed.  Endocrine   IFG (impaired fasting glucose)   Last A1C of 6.0 in Nov.  Impaired fasting glucose Recent A1c increase likely due to insulin resistance. Lifestyle changes expected to improve glycemic control. Glucovim effective for sugar cravings, dosage adjusted for neuropathy. - Recheck A1c in 6 months. - Continue lifestyle modifications including diet and exercise. - Continue Glucovim at one tablet per day.         Nervous and Auditory   Neuropathy   Weaned herself off of gabapentin . Polyneuropathy Burning in feet  possibly due to rapid A1c reduction. Symptoms improved with reduced Glucovim dosage. - Continue Glucovim at one tablet per day.        Other   KNEE PAIN, RIGHT   Treatment maintained with diclofenac  gel.  She has been exercising regularly again and has lost weight which is fantastic.      Relevant Medications   diclofenac  Sodium (VOLTAREN ) 1 % GEL   Other Visit Diagnoses       Foot pain, right       Relevant Medications   diclofenac  Sodium (VOLTAREN ) 1 % GEL     Primary osteoarthritis of both knees       Relevant Medications   diclofenac  Sodium (VOLTAREN ) 1 % GEL       Assessment and Plan Assessment & Plan       Return in about 5 months (around 01/14/2025) for Pre-diabetes.    Dorothyann Byars, MD [facility] at [facility]   "

## 2024-08-16 NOTE — Assessment & Plan Note (Signed)
 Treatment maintained with diclofenac  gel.  She has been exercising regularly again and has lost weight which is fantastic.

## 2024-08-16 NOTE — Assessment & Plan Note (Addendum)
 Weaned herself off of gabapentin . Polyneuropathy Burning in feet possibly due to rapid A1c reduction. Symptoms improved with reduced Glucovim dosage. - Continue Glucovim at one tablet per day.

## 2024-08-16 NOTE — Assessment & Plan Note (Addendum)
 Last A1C of 6.0 in Nov.  Impaired fasting glucose Recent A1c increase likely due to insulin resistance. Lifestyle changes expected to improve glycemic control. Glucovim effective for sugar cravings, dosage adjusted for neuropathy. - Recheck A1c in 6 months. - Continue lifestyle modifications including diet and exercise. - Continue Glucovim at one tablet per day.

## 2025-01-17 ENCOUNTER — Ambulatory Visit: Admitting: Family Medicine
